# Patient Record
Sex: Male | Born: 1970 | Race: White | Hispanic: No | Marital: Married | Smoking: Current every day smoker
Health system: Southern US, Community
[De-identification: ages and names within clinical notes are randomized; demographics above are authoritative.]

---

## 2004-01-18 ENCOUNTER — Emergency Department: Payer: Self-pay | Admitting: Emergency Medicine

## 2004-12-16 ENCOUNTER — Emergency Department: Payer: Self-pay | Admitting: Internal Medicine

## 2005-01-20 ENCOUNTER — Other Ambulatory Visit: Payer: Self-pay

## 2005-01-20 ENCOUNTER — Emergency Department: Payer: Self-pay | Admitting: Emergency Medicine

## 2008-03-19 ENCOUNTER — Emergency Department: Payer: Self-pay | Admitting: Emergency Medicine

## 2010-04-11 ENCOUNTER — Emergency Department (HOSPITAL_COMMUNITY)
Admission: EM | Admit: 2010-04-11 | Discharge: 2010-04-11 | Disposition: A | Discharge status: Home / Self Care | Payer: Self-pay | Attending: Emergency Medicine | Admitting: Emergency Medicine

## 2010-04-11 ENCOUNTER — Emergency Department (HOSPITAL_COMMUNITY): Payer: Self-pay

## 2010-04-11 DIAGNOSIS — M795 Residual foreign body in soft tissue: Secondary | ICD-10-CM | POA: Insufficient documentation

## 2010-04-11 DIAGNOSIS — L02619 Cutaneous abscess of unspecified foot: Secondary | ICD-10-CM | POA: Insufficient documentation

## 2010-04-11 DIAGNOSIS — S8990XA Unspecified injury of unspecified lower leg, initial encounter: Secondary | ICD-10-CM | POA: Insufficient documentation

## 2010-04-11 DIAGNOSIS — Z1833 Retained wood fragments: Secondary | ICD-10-CM | POA: Insufficient documentation

## 2010-04-11 DIAGNOSIS — W268XXA Contact with other sharp object(s), not elsewhere classified, initial encounter: Secondary | ICD-10-CM | POA: Insufficient documentation

## 2010-04-11 DIAGNOSIS — Y92009 Unspecified place in unspecified non-institutional (private) residence as the place of occurrence of the external cause: Secondary | ICD-10-CM | POA: Insufficient documentation

## 2010-04-11 DIAGNOSIS — S99919A Unspecified injury of unspecified ankle, initial encounter: Secondary | ICD-10-CM | POA: Insufficient documentation

## 2010-04-11 DIAGNOSIS — M79609 Pain in unspecified limb: Secondary | ICD-10-CM | POA: Insufficient documentation

## 2010-04-11 DIAGNOSIS — L03119 Cellulitis of unspecified part of limb: Secondary | ICD-10-CM | POA: Insufficient documentation

## 2010-04-11 DIAGNOSIS — M7989 Other specified soft tissue disorders: Secondary | ICD-10-CM | POA: Insufficient documentation

## 2010-05-14 ENCOUNTER — Emergency Department (HOSPITAL_COMMUNITY)
Admission: EM | Admit: 2010-05-14 | Discharge: 2010-05-14 | Disposition: A | Discharge status: Home / Self Care | Payer: Self-pay | Attending: Emergency Medicine | Admitting: Emergency Medicine

## 2010-05-14 ENCOUNTER — Emergency Department (HOSPITAL_COMMUNITY): Payer: Self-pay

## 2010-05-14 DIAGNOSIS — M7989 Other specified soft tissue disorders: Secondary | ICD-10-CM | POA: Insufficient documentation

## 2010-05-14 DIAGNOSIS — M79609 Pain in unspecified limb: Secondary | ICD-10-CM | POA: Insufficient documentation

## 2010-05-14 DIAGNOSIS — L02619 Cutaneous abscess of unspecified foot: Secondary | ICD-10-CM | POA: Insufficient documentation

## 2010-05-14 LAB — GRAM STAIN

## 2010-05-16 LAB — WOUND CULTURE

## 2010-06-03 ENCOUNTER — Emergency Department: Payer: Self-pay | Admitting: Emergency Medicine

## 2010-09-07 ENCOUNTER — Emergency Department (HOSPITAL_COMMUNITY)
Admission: EM | Admit: 2010-09-07 | Discharge: 2010-09-07 | Disposition: A | Discharge status: Home / Self Care | Payer: Self-pay | Attending: Emergency Medicine | Admitting: Emergency Medicine

## 2010-09-07 DIAGNOSIS — M7989 Other specified soft tissue disorders: Secondary | ICD-10-CM | POA: Insufficient documentation

## 2010-09-07 DIAGNOSIS — L02619 Cutaneous abscess of unspecified foot: Secondary | ICD-10-CM | POA: Insufficient documentation

## 2010-09-07 DIAGNOSIS — M25473 Effusion, unspecified ankle: Secondary | ICD-10-CM | POA: Insufficient documentation

## 2010-09-07 DIAGNOSIS — M79609 Pain in unspecified limb: Secondary | ICD-10-CM | POA: Insufficient documentation

## 2010-09-07 DIAGNOSIS — M25579 Pain in unspecified ankle and joints of unspecified foot: Secondary | ICD-10-CM | POA: Insufficient documentation

## 2010-09-07 DIAGNOSIS — M25476 Effusion, unspecified foot: Secondary | ICD-10-CM | POA: Insufficient documentation

## 2010-09-07 DIAGNOSIS — R209 Unspecified disturbances of skin sensation: Secondary | ICD-10-CM | POA: Insufficient documentation

## 2010-09-11 ENCOUNTER — Emergency Department (HOSPITAL_COMMUNITY)
Admission: EM | Admit: 2010-09-11 | Discharge: 2010-09-11 | Disposition: A | Discharge status: Home / Self Care | Payer: Self-pay | Attending: Emergency Medicine | Admitting: Emergency Medicine

## 2010-09-11 DIAGNOSIS — Z1833 Retained wood fragments: Secondary | ICD-10-CM | POA: Insufficient documentation

## 2010-09-11 DIAGNOSIS — M795 Residual foreign body in soft tissue: Secondary | ICD-10-CM | POA: Insufficient documentation

## 2011-03-15 ENCOUNTER — Emergency Department: Payer: Self-pay | Admitting: *Deleted

## 2011-09-03 IMAGING — CR DG FOOT COMPLETE 3+V*L*
3 series · 3 of 3 positions shown · non-contrast
Comparison: Left foot radiographs 04/11/2010.

CLINICAL DATA: Pain and swelling.  Possible foreign body.

LEFT FOOT - COMPLETE 3+ VIEW

[t foot ap left]
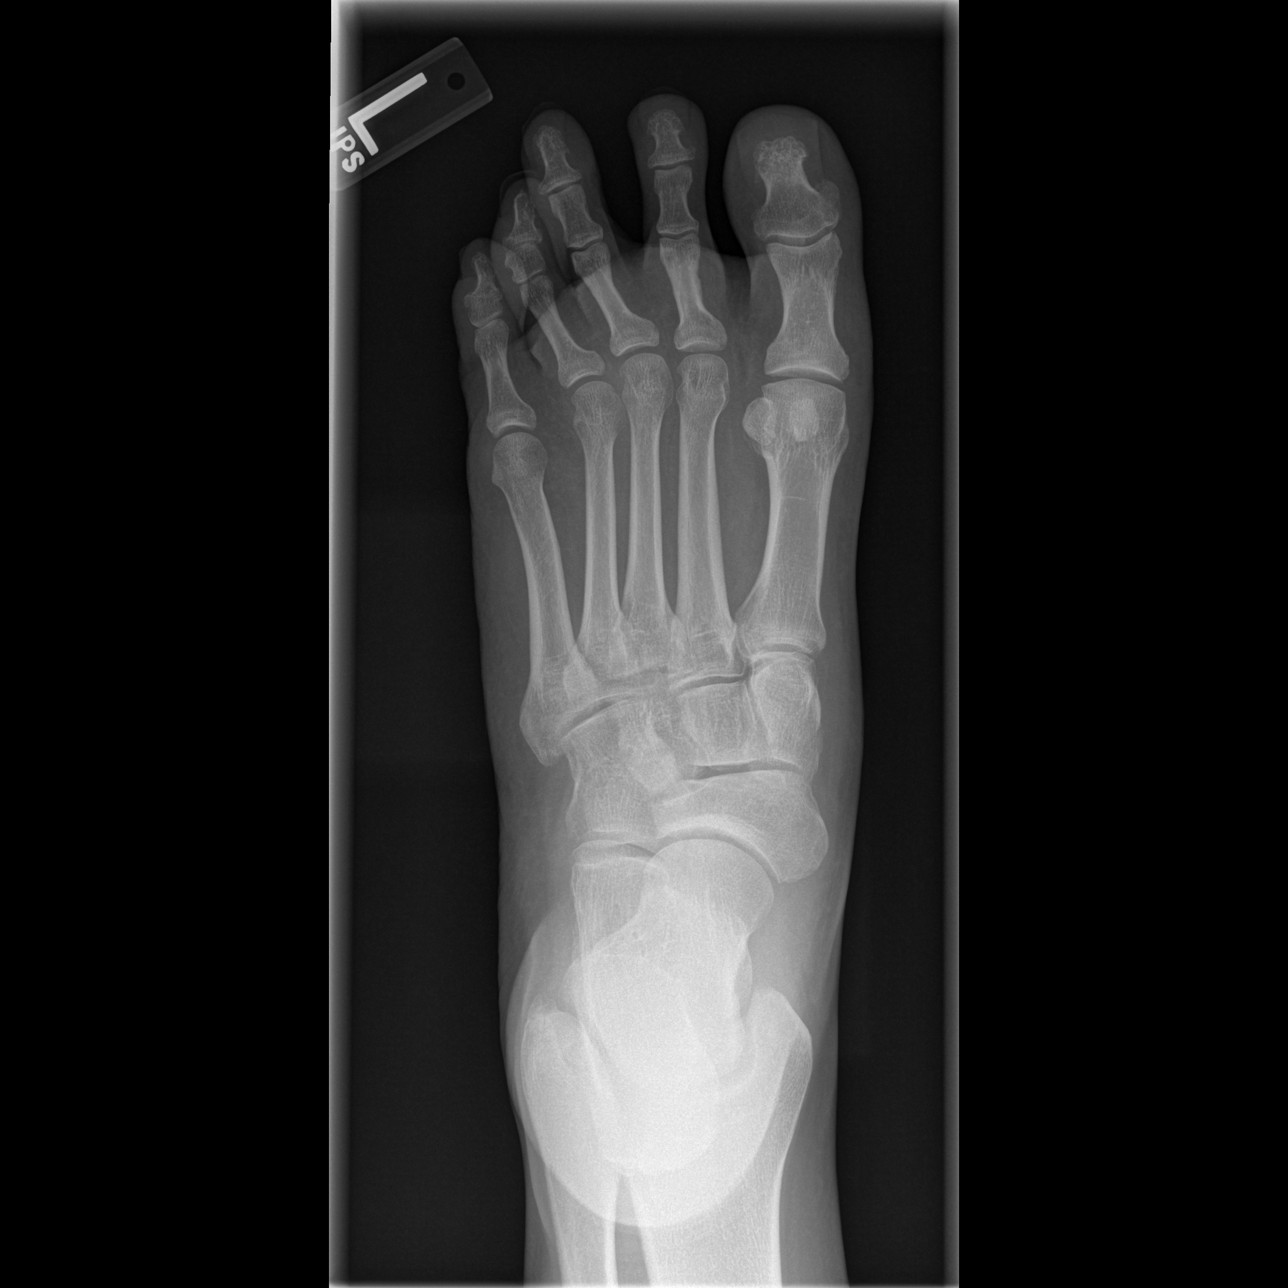

[t foot oblique left]
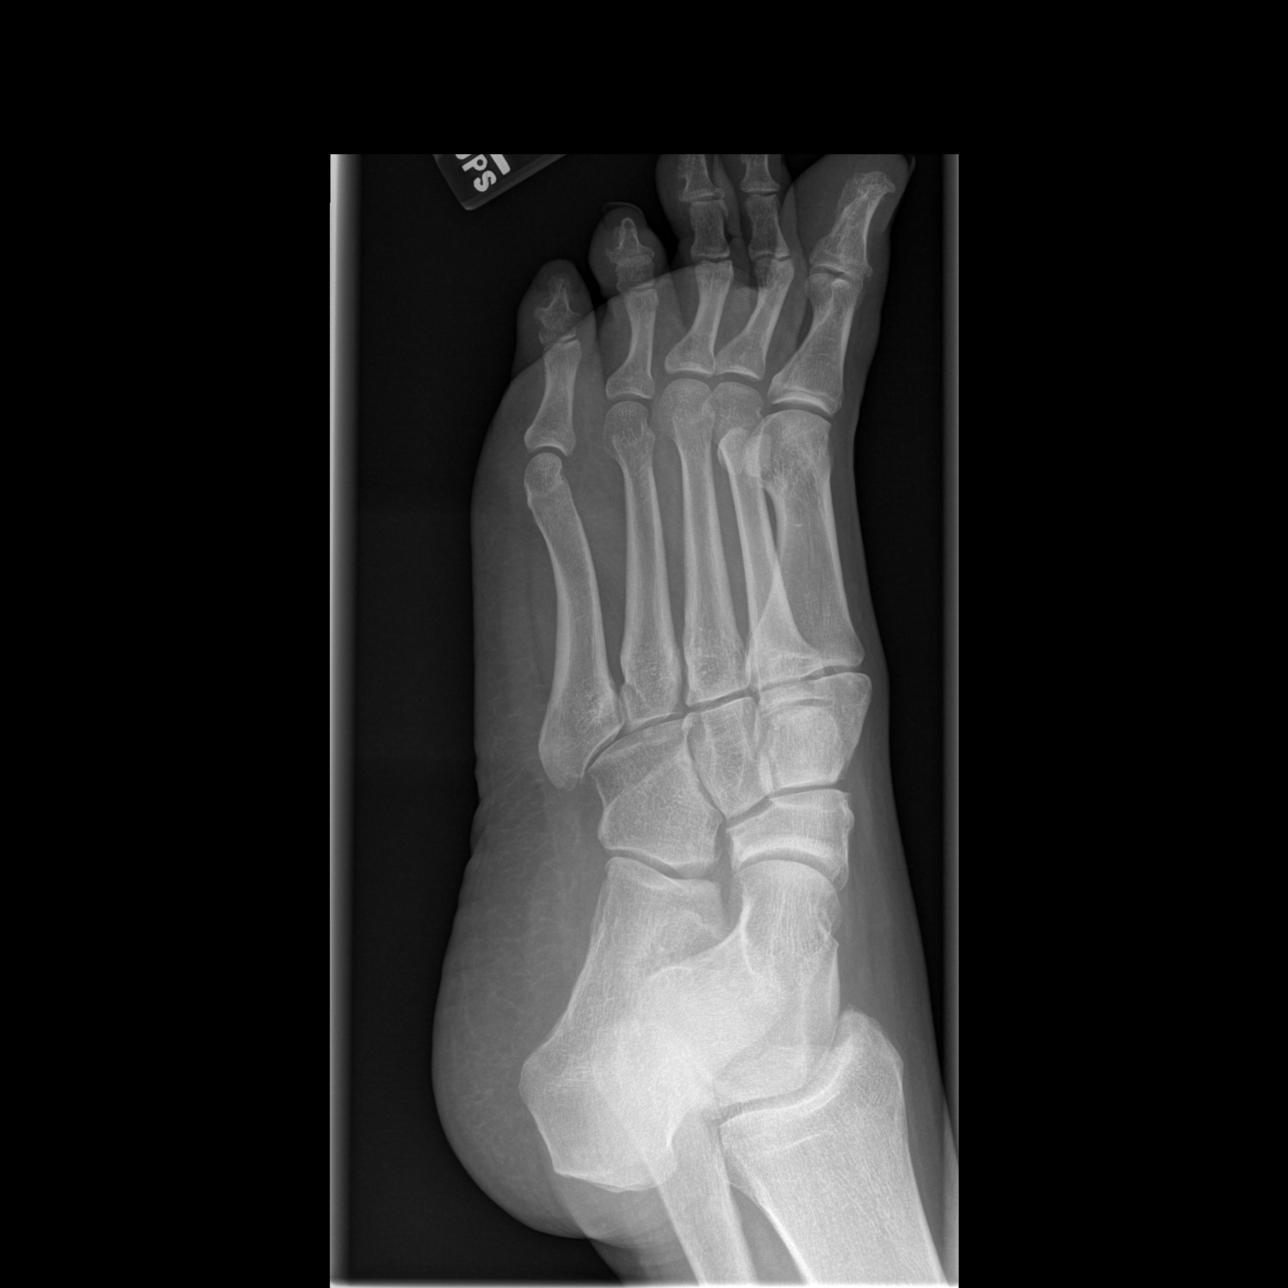

[t foot lat left]
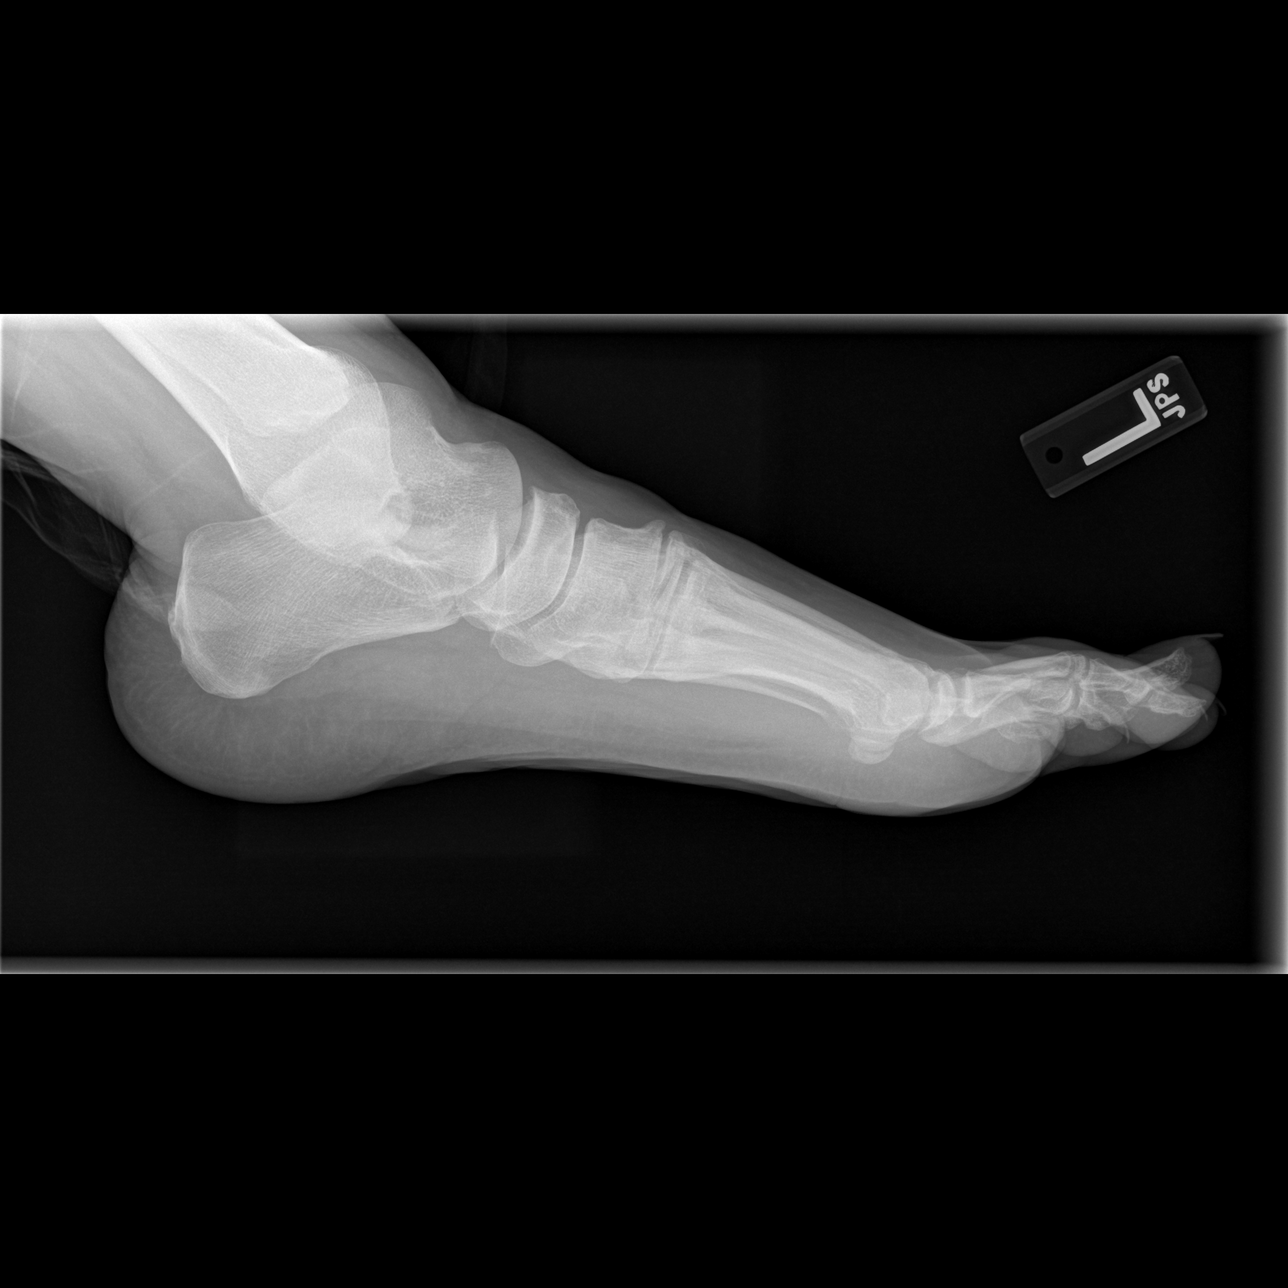

[3 of 3 positions shown; findings below may reference images not displayed]

FINDINGS: The joint spaces are maintained.  No definite plain film
findings for septic arthritis.  No destructive bony changes to
suggest osteomyelitis.  Soft tissue swelling between the second and
third digits is noted.
IMPRESSION: 1.  No acute bony findings or destructive bony changes.
2.  No radiopaque foreign body.

## 2011-11-09 ENCOUNTER — Emergency Department: Payer: Self-pay | Admitting: Emergency Medicine

## 2011-11-11 LAB — COMPREHENSIVE METABOLIC PANEL
Bilirubin,Total: 0.2 mg/dL (ref 0.2–1.0)
Calcium, Total: 8.8 mg/dL (ref 8.5–10.1)
Chloride: 112 mmol/L — ABNORMAL HIGH (ref 98–107)
Co2: 26 mmol/L (ref 21–32)
Creatinine: 1.04 mg/dL (ref 0.60–1.30)
EGFR (African American): >60
EGFR (Non-African Amer.): >60
Osmolality: 287 (ref 275–301)
Potassium: 4 mmol/L (ref 3.5–5.1)

## 2011-11-11 LAB — CBC
MCH: 29.8 pg (ref 26.0–34.0)
MCHC: 32.9 g/dL (ref 32.0–36.0)
Platelet: 299 10*3/uL (ref 150–440)

## 2011-11-11 LAB — DRUG SCREEN, URINE
Amphetamines, Ur Screen: NEGATIVE (ref ?–1000)
Benzodiazepine, Ur Scrn: NEGATIVE (ref ?–200)
Cannabinoid 50 Ng, Ur ~~LOC~~: POSITIVE (ref ?–50)
Cocaine Metabolite,Ur ~~LOC~~: NEGATIVE (ref ?–300)
MDMA (Ecstasy)Ur Screen: NEGATIVE (ref ?–500)
Opiate, Ur Screen: NEGATIVE (ref ?–300)
Phencyclidine (PCP) Ur S: NEGATIVE (ref ?–25)

## 2011-11-11 LAB — ETHANOL: Ethanol %: 0.215 % — ABNORMAL HIGH (ref 0.000–0.080)

## 2011-11-11 LAB — URINALYSIS, COMPLETE
Leukocyte Esterase: NEGATIVE
Nitrite: NEGATIVE
Ph: 7 (ref 4.5–8.0)
Protein: NEGATIVE
WBC UR: NONE SEEN /HPF (ref 0–5)

## 2011-11-11 LAB — TSH: Thyroid Stimulating Horm: 0.901 u[IU]/mL

## 2011-11-12 ENCOUNTER — Inpatient Hospital Stay: Payer: Self-pay | Admitting: Psychiatry

## 2012-02-02 ENCOUNTER — Emergency Department: Payer: Self-pay | Admitting: Emergency Medicine

## 2013-03-27 ENCOUNTER — Encounter (HOSPITAL_COMMUNITY): Payer: Self-pay | Admitting: Emergency Medicine

## 2013-03-27 ENCOUNTER — Emergency Department (HOSPITAL_COMMUNITY): Payer: Self-pay

## 2013-03-27 ENCOUNTER — Emergency Department (HOSPITAL_COMMUNITY)
Admission: EM | Admit: 2013-03-27 | Discharge: 2013-03-27 | Disposition: A | Discharge status: Home / Self Care | Payer: Self-pay | Attending: Emergency Medicine | Admitting: Emergency Medicine

## 2013-03-27 DIAGNOSIS — S39012A Strain of muscle, fascia and tendon of lower back, initial encounter: Secondary | ICD-10-CM

## 2013-03-27 DIAGNOSIS — F17200 Nicotine dependence, unspecified, uncomplicated: Secondary | ICD-10-CM | POA: Insufficient documentation

## 2013-03-27 DIAGNOSIS — R55 Syncope and collapse: Secondary | ICD-10-CM | POA: Insufficient documentation

## 2013-03-27 DIAGNOSIS — W1809XA Striking against other object with subsequent fall, initial encounter: Secondary | ICD-10-CM | POA: Insufficient documentation

## 2013-03-27 DIAGNOSIS — Y9389 Activity, other specified: Secondary | ICD-10-CM | POA: Insufficient documentation

## 2013-03-27 DIAGNOSIS — R112 Nausea with vomiting, unspecified: Secondary | ICD-10-CM | POA: Insufficient documentation

## 2013-03-27 DIAGNOSIS — S79929A Unspecified injury of unspecified thigh, initial encounter: Secondary | ICD-10-CM

## 2013-03-27 DIAGNOSIS — Y990 Civilian activity done for income or pay: Secondary | ICD-10-CM | POA: Insufficient documentation

## 2013-03-27 DIAGNOSIS — S79919A Unspecified injury of unspecified hip, initial encounter: Secondary | ICD-10-CM | POA: Insufficient documentation

## 2013-03-27 DIAGNOSIS — Y929 Unspecified place or not applicable: Secondary | ICD-10-CM | POA: Insufficient documentation

## 2013-03-27 DIAGNOSIS — E669 Obesity, unspecified: Secondary | ICD-10-CM | POA: Insufficient documentation

## 2013-03-27 DIAGNOSIS — R197 Diarrhea, unspecified: Secondary | ICD-10-CM | POA: Insufficient documentation

## 2013-03-27 DIAGNOSIS — S335XXA Sprain of ligaments of lumbar spine, initial encounter: Secondary | ICD-10-CM | POA: Insufficient documentation

## 2013-03-27 LAB — CBC
HEMATOCRIT: 41.6 % (ref 39.0–52.0)
HEMOGLOBIN: 14.3 g/dL (ref 13.0–17.0)
MCH: 30.7 pg (ref 26.0–34.0)
MCHC: 34.4 g/dL (ref 30.0–36.0)
MCV: 89.3 fL (ref 78.0–100.0)
Platelets: 213 10*3/uL (ref 150–400)
RBC: 4.66 MIL/uL (ref 4.22–5.81)
RDW: 13.6 % (ref 11.5–15.5)
WBC: 8.9 10*3/uL (ref 4.0–10.5)

## 2013-03-27 LAB — BASIC METABOLIC PANEL
BUN: 9 mg/dL (ref 6–23)
CALCIUM: 9.2 mg/dL (ref 8.4–10.5)
CO2: 24 mEq/L (ref 19–32)
Chloride: 104 mEq/L (ref 96–112)
Creatinine, Ser: 0.98 mg/dL (ref 0.50–1.35)
GFR calc Af Amer: >90 mL/min (ref >90)
GLUCOSE: 108 mg/dL — AB (ref 70–99)
Potassium: 3.7 mEq/L (ref 3.7–5.3)
Sodium: 140 mEq/L (ref 137–147)

## 2013-03-27 LAB — TROPONIN I: Troponin I: <0.3 ng/mL (ref <0.30)

## 2013-03-27 LAB — CBG MONITORING, ED: Glucose-Capillary: 104 mg/dL — ABNORMAL HIGH (ref 70–99)

## 2013-03-27 MED ORDER — ONDANSETRON HCL 4 MG/2ML IJ SOLN
4.0000 mg | Freq: Once | INTRAMUSCULAR | Status: AC
  Administered 2013-03-27: 4 mg via INTRAVENOUS
  Filled 2013-03-27: qty 2

## 2013-03-27 MED ORDER — DIAZEPAM 5 MG PO TABS: 5.0000 mg | ORAL_TABLET | Freq: Four times a day (QID) | ORAL | Status: DC | PRN

## 2013-03-27 MED ORDER — HYDROMORPHONE HCL PF 1 MG/ML IJ SOLN
1.0000 mg | Freq: Once | INTRAMUSCULAR | Status: AC
  Administered 2013-03-27: 1 mg via INTRAVENOUS
  Filled 2013-03-27: qty 1

## 2013-03-27 MED ORDER — SODIUM CHLORIDE 0.9 % IV BOLUS (SEPSIS)
1000.0000 mL | Freq: Once | INTRAVENOUS | Status: AC
  Administered 2013-03-27: 1000 mL via INTRAVENOUS

## 2013-03-27 MED ORDER — FENTANYL CITRATE 0.05 MG/ML IJ SOLN
50.0000 ug | Freq: Once | INTRAMUSCULAR | Status: AC
  Administered 2013-03-27: 50 ug via INTRAVENOUS
  Filled 2013-03-27: qty 2

## 2013-03-27 MED ORDER — OXYCODONE-ACETAMINOPHEN 5-325 MG PO TABS: 1.0000 | ORAL_TABLET | Freq: Four times a day (QID) | ORAL | Status: DC | PRN

## 2013-03-27 MED ORDER — KETOROLAC TROMETHAMINE 30 MG/ML IJ SOLN
30.0000 mg | Freq: Once | INTRAMUSCULAR | Status: AC
  Administered 2013-03-27: 30 mg via INTRAVENOUS
  Filled 2013-03-27: qty 1

## 2013-03-27 MED ORDER — PROMETHAZINE HCL 25 MG PO TABS: 25.0000 mg | ORAL_TABLET | Freq: Four times a day (QID) | ORAL | Status: DC | PRN

## 2013-03-27 NOTE — ED Notes (Signed)
Pt returned from xr

## 2013-03-27 NOTE — ED Notes (Signed)
Per ems-- pt had syncope episode at work. Fell and loc for a few seconds. Pt now c/o L sided back pain. Did not hit head. Pt pale diaphoretic upon ems arrival. nvd since yesterday as well. 12 lead unremarkable. Denies cp, sob. Has not seen pcp in 20 years and eats a cheeseburger everyday. Pt ambulatory on scene.

## 2013-03-27 NOTE — ED Notes (Signed)
Pt tolerated crackers and water.

## 2013-03-27 NOTE — Discharge Instructions (Signed)
Lumbosacral Strain Lumbosacral strain is a strain of any of the parts that make up your lumbosacral vertebrae. Your lumbosacral vertebrae are the bones that make up the lower third of your backbone. Your lumbosacral vertebrae are held together by muscles and tough, fibrous tissue (ligaments).  CAUSES  A sudden blow to your back can cause lumbosacral strain. Also, anything that causes an excessive stretch of the muscles in the low back can cause this strain. This is typically seen when people exert themselves strenuously, fall, lift heavy objects, bend, or crouch repeatedly. RISK FACTORS  Physically demanding work.  Participation in pushing or pulling sports or sports that require sudden twist of the back (tennis, golf, baseball).  Weight lifting.  Excessive lower back curvature.  Forward-tilted pelvis.  Weak back or abdominal muscles or both.  Tight hamstrings. SIGNS AND SYMPTOMS  Lumbosacral strain may cause pain in the area of your injury or pain that moves (radiates) down your leg.  DIAGNOSIS Your health care provider can often diagnose lumbosacral strain through a physical exam. In some cases, you may need tests such as X-ray exams.  TREATMENT  Treatment for your lower back injury depends on many factors that your clinician will have to evaluate. However, most treatment will include the use of anti-inflammatory medicines. HOME CARE INSTRUCTIONS   Avoid hard physical activities (tennis, racquetball, waterskiing) if you are not in proper physical condition for it. This may aggravate or create problems.  If you have a back problem, avoid sports requiring sudden body movements. Swimming and walking are generally safer activities.  Maintain good posture.  Maintain a healthy weight.  For acute conditions, you may put ice on the injured area.  Put ice in a plastic bag.  Place a towel between your skin and the bag.  Leave the ice on for 20 minutes, 2 3 times a day.  When the  low back starts healing, stretching and strengthening exercises may be recommended. SEEK MEDICAL CARE IF:  Your back pain is getting worse.  You experience severe back pain not relieved with medicines. SEEK IMMEDIATE MEDICAL CARE IF:   You have numbness, tingling, weakness, or problems with the use of your arms or legs.  There is a change in bowel or bladder control.  You have increasing pain in any area of the body, including your belly (abdomen).  You notice shortness of breath, dizziness, or feel faint.  You feel sick to your stomach (nauseous), are throwing up (vomiting), or become sweaty.  You notice discoloration of your toes or legs, or your feet get very cold. MAKE SURE YOU:   Understand these instructions.  Will watch your condition.  Will get help right away if you are not doing well or get worse. Document Released: 10/15/2004 Document Revised: 10/26/2012 Document Reviewed: 08/24/2012 [facility] Patient Information 2014 [place], [place].  Nausea and Vomiting Nausea is a sick feeling that often comes before throwing up (vomiting). Vomiting is a reflex where stomach contents come out of your mouth. Vomiting can cause severe loss of body fluids (dehydration). Children and elderly adults can become dehydrated quickly, especially if they also have diarrhea. Nausea and vomiting are symptoms of a condition or disease. It is important to find the cause of your symptoms. CAUSES   Direct irritation of the stomach lining. This irritation can result from increased acid production (gastroesophageal reflux disease), infection, food poisoning, taking certain medicines (such as nonsteroidal anti-inflammatory drugs), alcohol use, or tobacco use.  Signals from the brain.These signals could be  caused by a headache, heat exposure, an inner ear disturbance, increased pressure in the brain from injury, infection, a tumor, or a concussion, pain, emotional stimulus, or metabolic problems.  An  obstruction in the gastrointestinal tract (bowel obstruction).  Illnesses such as diabetes, hepatitis, gallbladder problems, appendicitis, kidney problems, cancer, sepsis, atypical symptoms of a heart attack, or eating disorders.  Medical treatments such as chemotherapy and radiation.  Receiving medicine that makes you sleep (general anesthetic) during surgery. DIAGNOSIS Your caregiver may ask for tests to be done if the problems do not improve after a few days. Tests may also be done if symptoms are severe or if the reason for the nausea and vomiting is not clear. Tests may include:  Urine tests.  Blood tests.  Stool tests.  Cultures (to look for evidence of infection).  X-rays or other imaging studies. Test results can help your caregiver make decisions about treatment or the need for additional tests. TREATMENT You need to stay well hydrated. Drink frequently but in small amounts.You may wish to drink water, sports drinks, clear broth, or eat frozen ice pops or gelatin dessert to help stay hydrated.When you eat, eating slowly may help prevent nausea.There are also some antinausea medicines that may help prevent nausea. HOME CARE INSTRUCTIONS   Take all medicine as directed by your caregiver.  If you do not have an appetite, do not force yourself to eat. However, you must continue to drink fluids.  If you have an appetite, eat a normal diet unless your caregiver tells you differently.  Eat a variety of complex carbohydrates (rice, wheat, potatoes, bread), lean meats, yogurt, fruits, and vegetables.  Avoid high-fat foods because they are more difficult to digest.  Drink enough water and fluids to keep your urine clear or pale yellow.  If you are dehydrated, ask your caregiver for specific rehydration instructions. Signs of dehydration may include:  Severe thirst.  Dry lips and mouth.  Dizziness.  Dark urine.  Decreasing urine frequency and  amount.  Confusion.  Rapid breathing or pulse. SEEK IMMEDIATE MEDICAL CARE IF:   You have blood or brown flecks (like coffee grounds) in your vomit.  You have black or bloody stools.  You have a severe headache or stiff neck.  You are confused.  You have severe abdominal pain.  You have chest pain or trouble breathing.  You do not urinate at least once every 8 hours.  You develop cold or clammy skin.  You continue to vomit for longer than 24 to 48 hours.  You have a fever. MAKE SURE YOU:   Understand these instructions.  Will watch your condition.  Will get help right away if you are not doing well or get worse. Document Released: 01/05/2005 Document Revised: 03/30/2011 Document Reviewed: 06/04/2010 [facility] Patient Information 2014 [place], [place].  Syncope Syncope is a fainting spell. This means the person loses consciousness and drops to the ground. The person is generally unconscious for less than 5 minutes. The person may have some muscle twitches for up to 15 seconds before waking up and returning to normal. Syncope occurs more often in elderly people, but it can happen to anyone. While most causes of syncope are not dangerous, syncope can be a sign of a serious medical problem. It is important to seek medical care.  CAUSES  Syncope is caused by a sudden decrease in blood flow to the brain. The specific cause is often not determined. Factors that can trigger syncope include:  Taking medicines that  lower blood pressure.  Sudden changes in posture, such as standing up suddenly.  Taking more medicine than prescribed.  Standing in one place for too long.  Seizure disorders.  Dehydration and excessive exposure to heat.  Low blood sugar (hypoglycemia).  Straining to have a bowel movement.  Heart disease, irregular heartbeat, or other circulatory problems.  Fear, emotional distress, seeing blood, or severe pain. SYMPTOMS  Right before fainting, you  may:  Feel dizzy or lightheaded.  Feel nauseous.  See all white or all black in your field of vision.  Have cold, clammy skin. DIAGNOSIS  Your caregiver will ask about your symptoms, perform a physical exam, and perform electrocardiography (ECG) to record the electrical activity of your heart. Your caregiver may also perform other heart or blood tests to determine the cause of your syncope. TREATMENT  In most cases, no treatment is needed. Depending on the cause of your syncope, your caregiver may recommend changing or stopping some of your medicines. HOME CARE INSTRUCTIONS  Have someone stay with you until you feel stable.  Do not drive, operate machinery, or play sports until your caregiver says it is okay.  Keep all follow-up appointments as directed by your caregiver.  Lie down right away if you start feeling like you might faint. Breathe deeply and steadily. Wait until all the symptoms have passed.  Drink enough fluids to keep your urine clear or pale yellow.  If you are taking blood pressure or heart medicine, get up slowly, taking several minutes to sit and then stand. This can reduce dizziness. SEEK IMMEDIATE MEDICAL CARE IF:   You have a severe headache.  You have unusual pain in the chest, abdomen, or back.  You are bleeding from the mouth or rectum, or you have black or tarry stool.  You have an irregular or very fast heartbeat.  You have pain with breathing.  You have repeated fainting or seizure-like jerking during an episode.  You faint when sitting or lying down.  You have confusion.  You have difficulty walking.  You have severe weakness.  You have vision problems. If you fainted, call your local emergency services (911 in [place] Do not drive yourself to the hospital.  MAKE SURE YOU:  Understand these instructions.  Will watch your condition.  Will get help right away if you are not doing well or get worse. Document Released: 01/05/2005  Document Revised: 07/07/2011 Document Reviewed: 03/06/2011 [facility] Patient Information 2014 [place], [place].

## 2013-03-27 NOTE — ED Provider Notes (Signed)
CSN: [id number]     Arrival date & time 03/27/13  1555 History   First MD Initiated Contact with Patient 03/27/13 1632     Chief Complaint  Patient presents with  . Loss of Consciousness     (Consider location/radiation/quality/duration/timing/severity/associated sxs/prior Treatment) Patient is a 43 y.o. male presenting with syncope. The history is provided by the patient.  Loss of Consciousness Associated symptoms: nausea and vomiting   Associated symptoms: no chest pain, no headaches, no shortness of breath and no weakness    patient is complaining of syncope. He states he was working today and began to feel lightheaded and tingly. He states he then passed out. He denies chest pain. He denies feeling his heart racing to slow. He states he woke up not exactly known what happened. He has had some nausea vomiting diarrhea for the last day or 2. No fevers. He felt somewhat fatigued earlier today, but states do not feel anything was bad enough to make him pass out. He's had one episode like this in the past. No swelling in his legs. He states it feels better now. He states he was helped to the ground, but hit his left back on the way down. He states he has severe pain in his left hip and back has been unrelieved pain medicine given by EMS. No numbness or weakness. He does not have previous back problems.  History reviewed. No pertinent past medical history. History reviewed. No pertinent past surgical history. No family history on file. History  Substance Use Topics  . Smoking status: Current Every Day Smoker  . Smokeless tobacco: Not on file  . Alcohol Use: No    Review of Systems  Constitutional: Negative for activity change and appetite change.  Eyes: Negative for pain.  Respiratory: Negative for chest tightness and shortness of breath.   Cardiovascular: Positive for syncope. Negative for chest pain and leg swelling.  Gastrointestinal: Positive for nausea, vomiting and diarrhea.  Negative for abdominal pain.  Genitourinary: Negative for flank pain.  Musculoskeletal: Positive for back pain. Negative for neck pain and neck stiffness.  Skin: Negative for rash.  Neurological: Positive for syncope. Negative for weakness, numbness and headaches.  Psychiatric/Behavioral: Negative for behavioral problems.      Allergies  Review of patient's allergies indicates no known allergies.  Home Medications   Current Outpatient Rx  Name  Route  Sig  Dispense  Refill  . ALPRAZolam (XANAX) 0.5 MG tablet   Oral   Take 0.5 mg by mouth daily as needed for anxiety.         Marland Kitchen guaiFENesin (MUCINEX) 600 MG 12 hr tablet   Oral   Take 600 mg by mouth daily as needed. For cold         . guaiFENesin (ROBITUSSIN) 100 MG/5ML liquid   Oral   Take 200 mg by mouth daily as needed for cough or congestion.         Marland Kitchen ibuprofen (ADVIL,MOTRIN) 200 MG tablet   Oral   Take 200 mg by mouth every 6 (six) hours as needed. For teeth pain         . Nutritional Supplements (COLD AND FLU) THERAPY PACK MISC   Oral   Take 1 each by mouth daily as needed. Patient states he takes 1 packet as needed for cold.         . diazepam (VALIUM) 5 MG tablet   Oral   Take 1 tablet (5 mg total) by mouth every  6 (six) hours as needed for muscle spasms.   10 tablet   0   . oxyCODONE-acetaminophen (PERCOCET/ROXICET) 5-325 MG per tablet   Oral   Take 1-2 tablets by mouth every 6 (six) hours as needed for severe pain.   115 tablet   0   . promethazine (PHENERGAN) 25 MG tablet   Oral   Take 1 tablet (25 mg total) by mouth every 6 (six) hours as needed for nausea or vomiting.   10 tablet   0    BP 109/65  Pulse 63  Temp(Src) 98.7 F (37.1 C) (Oral)  Resp 15  Ht 5\' 11"  (1.803 m)  Wt 240 lb (108.863 kg)  BMI 33.49 kg/m2  SpO2 91% Physical Exam  Nursing note and vitals reviewed. Constitutional: He is oriented to person, place, and time. He appears well-developed and well-nourished.   Patient is obese  HENT:  Head: Normocephalic and atraumatic.  Eyes: EOM are normal. Pupils are equal, round, and reactive to light.  Neck: Normal range of motion. Neck supple.  Cardiovascular: Normal rate, regular rhythm and normal heart sounds.   No murmur heard. Pulmonary/Chest: Effort normal and breath sounds normal.  Abdominal: Soft. Bowel sounds are normal. He exhibits no distension and no mass. There is no tenderness. There is no rebound and no guarding.  Musculoskeletal: Normal range of motion. He exhibits tenderness. He exhibits no edema.  Tenderness to lower lumbar spine and left SI area. No step-off or deformity. Some mild pain with straight leg raise on left. Neurovascularly intact over bilateral lower extremities  Neurological: He is alert and oriented to person, place, and time. No cranial nerve deficit.  Skin: Skin is warm and dry.  Psychiatric: He has a normal mood and affect.    ED Course  Procedures (including critical care time) Labs Review Labs Reviewed  BASIC METABOLIC PANEL - Abnormal; Notable for the following:    Glucose, Bld 108 (*)    All other components within normal limits  CBG MONITORING, ED - Abnormal; Notable for the following:    Glucose-Capillary 104 (*)    All other components within normal limits  CBC  TROPONIN I   Imaging Review Dg Chest 2 View  03/27/2013   CLINICAL DATA:  Syncope today.  No chest complaints.  EXAM: CHEST  2 VIEW  COMPARISON:  None.  FINDINGS: The heart size and mediastinal contours are within normal limits. Both lungs are clear. The visualized skeletal structures are unremarkable.  IMPRESSION: No active cardiopulmonary disease.   Electronically Signed   By: Amie Portland M.D.   On: 03/27/2013 17:28   Dg Lumbar Spine Complete  03/27/2013   CLINICAL DATA:  Syncope today.  Low back pain.  EXAM: LUMBAR SPINE - COMPLETE 4+ VIEW  COMPARISON:  None.  FINDINGS: There is no evidence of lumbar spine fracture. Alignment is normal.  Intervertebral disc spaces are maintained.  IMPRESSION: Negative.   Electronically Signed   By: Amie Portland M.D.   On: 03/27/2013 17:28   Dg Pelvis 1-2 Views  03/27/2013   CLINICAL DATA:  Syncope today.  Posterior left pelvic pain.  EXAM: PELVIS - 1-2 VIEW  COMPARISON:  None.  FINDINGS: There is no evidence of pelvic fracture or diastasis. No other pelvic bone lesions are seen.  IMPRESSION: Negative.   Electronically Signed   By: Amie Portland M.D.   On: 03/27/2013 17:29     EKG Interpretation None      Date: 03/27/2013  Rate: 77  Rhythm: normal sinus rhythm  QRS Axis: normal  Intervals: normal  ST/T Wave abnormalities: normal  Conduction Disutrbances:none  Narrative Interpretation: possible left atrial enlargement  Old EKG Reviewed: none available   MDM   Final diagnoses:  Syncope  Nausea vomiting and diarrhea  Lumbar strain    Patient with syncope. Likely related to dehydration and possibly vagal event with his nausea vomiting diarrhea. He does have some lumbar back pain. Negative x-rays. He feels much better after treatment and will be discharged home.    Juliet RudeNathan R. Rubin PayorPickering, MD 03/27/13 [phone]

## 2013-03-27 NOTE — ED Notes (Signed)
Pt transported to xray 

## 2013-03-27 NOTE — ED Notes (Signed)
Pt in nad. 
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
## 2016-11-20 ENCOUNTER — Encounter (HOSPITAL_COMMUNITY): Payer: Self-pay | Admitting: Emergency Medicine

## 2016-11-20 ENCOUNTER — Emergency Department (HOSPITAL_COMMUNITY)
Admission: EM | Admit: 2016-11-20 | Discharge: 2016-11-20 | Disposition: A | Discharge status: Home / Self Care | Payer: Self-pay | Attending: Emergency Medicine | Admitting: Emergency Medicine

## 2016-11-20 DIAGNOSIS — F17210 Nicotine dependence, cigarettes, uncomplicated: Secondary | ICD-10-CM | POA: Insufficient documentation

## 2016-11-20 DIAGNOSIS — Y990 Civilian activity done for income or pay: Secondary | ICD-10-CM | POA: Insufficient documentation

## 2016-11-20 DIAGNOSIS — T148XXA Other injury of unspecified body region, initial encounter: Secondary | ICD-10-CM

## 2016-11-20 DIAGNOSIS — Y929 Unspecified place or not applicable: Secondary | ICD-10-CM | POA: Insufficient documentation

## 2016-11-20 DIAGNOSIS — X500XXA Overexertion from strenuous movement or load, initial encounter: Secondary | ICD-10-CM | POA: Insufficient documentation

## 2016-11-20 DIAGNOSIS — S39012A Strain of muscle, fascia and tendon of lower back, initial encounter: Secondary | ICD-10-CM | POA: Insufficient documentation

## 2016-11-20 DIAGNOSIS — Z79899 Other long term (current) drug therapy: Secondary | ICD-10-CM | POA: Insufficient documentation

## 2016-11-20 DIAGNOSIS — Y9389 Activity, other specified: Secondary | ICD-10-CM | POA: Insufficient documentation

## 2016-11-20 DIAGNOSIS — M79606 Pain in leg, unspecified: Secondary | ICD-10-CM | POA: Insufficient documentation

## 2016-11-20 DIAGNOSIS — R202 Paresthesia of skin: Secondary | ICD-10-CM | POA: Insufficient documentation

## 2016-11-20 MED ORDER — DIAZEPAM 5 MG PO TABS
10.0000 mg | ORAL_TABLET | Freq: Once | ORAL | Status: AC
  Administered 2016-11-20: 10 mg via ORAL
  Filled 2016-11-20: qty 2

## 2016-11-20 MED ORDER — GABAPENTIN 300 MG PO CAPS: 300.0000 mg | ORAL_CAPSULE | Freq: Three times a day (TID) | ORAL | 0 refills | Status: DC

## 2016-11-20 MED ORDER — CYCLOBENZAPRINE HCL 10 MG PO TABS: 10.0000 mg | ORAL_TABLET | Freq: Two times a day (BID) | ORAL | 0 refills | Status: DC | PRN

## 2016-11-20 MED ORDER — KETOROLAC TROMETHAMINE 60 MG/2ML IM SOLN
60.0000 mg | Freq: Once | INTRAMUSCULAR | Status: AC
  Administered 2016-11-20: 60 mg via INTRAMUSCULAR
  Filled 2016-11-20: qty 2

## 2016-11-20 NOTE — Discharge Instructions (Signed)
1 tablet of Flexeril, which is a muscle relaxer, may be taken up to 2 times per day as needed for muscle pain or spasms.  Do not drive or work while taking this medication because it can make you too sleepy to drive or work.  I have refilled your gabapentin at the dose that you were previously taking.  Take 1 tablet of 300 mg once every 8 hours for the next 5 days.  If this medication continues to help you, please call the number on your discharge paperwork to get established with a primary care provider.  You may also call [facility] to tell them you are a follow-up visit from the emergency department.  600 mg of ibuprofen may be taken once every 6-8 hours with food to help with pain and inflammation.  Apply ice for 15-20 minutes up to 3-4 times a day to help with your symptoms as well.  As your pain allows, please begin to stretch your muscles so that they do not get tight.  If you develop worsening symptoms including losing control of your bowel or bladder, weakness and your legs, a new injury, a rash, or any other new concerning symptoms, please return to the emergency department for reevaluation.

## 2016-11-20 NOTE — ED Notes (Signed)
Patient verbalized understanding of discharge instructions and denies any further needs or questions at this time. VS stable. Patient ambulatory with steady gait. Escorted to ED entrance in wheelchair.   

## 2016-11-20 NOTE — ED Triage Notes (Signed)
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 Described as sharp, shooting. States has had this happen before. Able to ambulate slowly.

## 2016-11-20 NOTE — ED Provider Notes (Signed)
[facility] EMERGENCY DEPARTMENT Provider Note   CSN: [id number] Arrival date & time: 11/20/16  1808     History   Chief Complaint Chief Complaint  Patient presents with  . Back Pain    HPI Michael House is a 46 y.o. male.  The history is provided by the patient. No language interpreter was used.  Back Pain   This is a new problem. The current episode started 6 to 12 hours ago. The problem occurs constantly. The problem has been gradually worsening. 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 The pain is present in the lumbar spine. The quality of the pain is described as shooting. The pain radiates to the left knee. The pain is at a severity of 10/10. The pain is severe. The symptoms are aggravated by bending and twisting (walking). Associated symptoms include leg pain and paresthesias. Pertinent negatives include no chest pain, no numbness, no abdominal pain, no bowel incontinence, no perianal numbness, no pelvic pain, no paresis, no tingling and no weakness. He has tried nothing for the symptoms. Risk factors include obesity.  He also denies dysuria, fever, and chills. No h/o of nephrolithiasis.   History reviewed. No pertinent past medical history.  There are no active problems to display for this patient.   History reviewed. No pertinent surgical history.     Home Medications    Prior to Admission medications   Medication Sig Start Date End Date Taking? Authorizing Provider  ALPRAZolam Prudy Feeler) 0.5 MG tablet Take 0.5 mg by mouth daily as needed for anxiety.    [provider]  cyclobenzaprine (FLEXERIL) 10 MG tablet Take 1 tablet (10 mg total) by mouth 2 (two) times daily as needed for muscle spasms. 11/20/16   Nina Mondor A, PA-C  diazepam (VALIUM) 5 MG tablet Take 1 tablet (5 mg total) by mouth every 6 (six) hours as needed for muscle spasms. 03/27/13   Benjiman Core, MD  gabapentin (NEURONTIN) 300 MG capsule Take 1 capsule  (300 mg total) by mouth 3 (three) times daily. 11/20/16 11/25/16  Avangeline Stockburger A, PA-C  guaiFENesin (MUCINEX) 600 MG 12 hr tablet Take 600 mg by mouth daily as needed. For cold    [provider]  guaiFENesin (ROBITUSSIN) 100 MG/5ML liquid Take 200 mg by mouth daily as needed for cough or congestion.    [provider]  ibuprofen (ADVIL,MOTRIN) 200 MG tablet Take 200 mg by mouth every 6 (six) hours as needed. For teeth pain    [provider]  Nutritional Supplements (COLD AND FLU) THERAPY PACK MISC Take 1 each by mouth daily as needed. Patient states he takes 1 packet as needed for cold.    [provider]  oxyCODONE-acetaminophen (PERCOCET/ROXICET) 5-325 MG per tablet Take 1-2 tablets by mouth every 6 (six) hours as needed for severe pain. 03/27/13   Benjiman Core, MD  promethazine (PHENERGAN) 25 MG tablet Take 1 tablet (25 mg total) by mouth every 6 (six) hours as needed for nausea or vomiting. 03/27/13   Benjiman Core, MD    Family History History reviewed. No pertinent family history.  Social History Social History  Substance Use Topics  . Smoking status: Current Every Day Smoker  . Smokeless tobacco: Never Used  . Alcohol use No     Allergies   Patient has no known allergies.   Review of Systems Review of Systems  Constitutional: Negative for activity change.  Respiratory: Negative for shortness of breath.   Cardiovascular:  Negative for chest pain.  Gastrointestinal: Negative for abdominal pain and bowel incontinence.  Genitourinary: Negative for pelvic pain.  Musculoskeletal: Positive for back pain.  Skin: Negative for rash.  Neurological: Positive for paresthesias. Negative for tingling, weakness and numbness.     Physical Exam Updated Vital Signs BP (!) 157/94 (BP Location: Left Arm)   Pulse 92   Temp 98.5 F (36.9 C) (Oral)   Resp 18   SpO2 98%   Physical Exam  Constitutional: He is oriented to person, place, and time.  He appears well-developed.  HENT:  Head: Normocephalic.  Eyes: Conjunctivae are normal.  Neck: Neck supple.  Cardiovascular: Normal rate and regular rhythm.   No murmur heard. Pulmonary/Chest: Effort normal.  Abdominal: Soft. He exhibits no distension.  Musculoskeletal: Normal range of motion. He exhibits tenderness. He exhibits no edema or deformity.  TTP over the left paraspinal muscles of the lumbar spine. No tenderness to palpation of the spinous processes of the cervical, thoracic, or lumbar spine.  Able to bear weight on the bilateral lower extremities.  Independently moves all digits on the bilateral feet. Antalgic gait. FROM of the bilateral hips, knees, and ankles. 2+ DP and PT pulses. 5/5 strength of the bilateral lower extremities. No sensory deficits.  No overlying ecchymosis, erythema, edema, warmth, or rash of the back or bilateral lower extremities.   Neurological: He is alert and oriented to person, place, and time.  Skin: Skin is warm and dry. No rash noted.  Psychiatric: His behavior is normal.  Nursing note and vitals reviewed.    ED Treatments / Results  Labs (all labs ordered are listed, but only abnormal results are displayed) Labs Reviewed - No data to display  EKG  EKG Interpretation None       Radiology No results found.  Procedures Procedures (including critical care time)  Medications Ordered in ED Medications  diazepam (VALIUM) tablet 10 mg (10 mg Oral Given 11/20/16 2057)  ketorolac (TORADOL) injection 60 mg (60 mg Intramuscular Given 11/20/16 2057)     Initial Impression / Assessment and Plan / ED Course  I have reviewed the triage vital signs and the nursing notes.  Pertinent labs & imaging results that were available during my care of the patient were reviewed by me and considered in my medical decision making (see chart for details).     Patient with back pain, mechanical in nature.  No neurological deficits and normal neuro exam.   Patient can walk but states it is painful.  No urinary or bowel incontinence.  No concern for cauda equina, infection, AAA, infection, or fracture.  No constitutional symptoms including fever, chills, or weight loss,  No h/o cancer, IVDU.  Will discharge to home with RICE protocol and anti-inflammatory medicine.  The patient was previously taking gabapentin, but has run out of the medication.  Will re-prescribe his same home dose for a 5-day course and encourage the patient to follow-up with primary care if this improves his symptoms. discussed ED return precaution and indications for PCP follow up. The patient acknowledges the plan and is agreeable at this time.   Final Clinical Impressions(s) / ED Diagnoses   Final diagnoses:  Strain of lumbar region, initial encounter  Muscle strain    New Prescriptions New Prescriptions   CYCLOBENZAPRINE (FLEXERIL) 10 MG TABLET    Take 1 tablet (10 mg total) by mouth 2 (two) times daily as needed for muscle spasms.   GABAPENTIN (NEURONTIN) 300 MG CAPSULE  Take 1 capsule (300 mg total) by mouth 3 (three) times daily.     Barkley BoardsMcDonald, Lorin Hauck A, PA-C 11/20/16 2110    Jacalyn LefevreHaviland, Julie, MD 11/20/16 2120

## 2016-12-07 ENCOUNTER — Other Ambulatory Visit: Payer: Self-pay

## 2016-12-07 ENCOUNTER — Encounter (HOSPITAL_COMMUNITY): Payer: Self-pay | Admitting: *Deleted

## 2016-12-07 ENCOUNTER — Emergency Department (HOSPITAL_COMMUNITY)
Admission: EM | Admit: 2016-12-07 | Discharge: 2016-12-07 | Disposition: A | Discharge status: Home / Self Care | Payer: Self-pay | Attending: Emergency Medicine | Admitting: Emergency Medicine

## 2016-12-07 DIAGNOSIS — Z79899 Other long term (current) drug therapy: Secondary | ICD-10-CM | POA: Insufficient documentation

## 2016-12-07 DIAGNOSIS — F17210 Nicotine dependence, cigarettes, uncomplicated: Secondary | ICD-10-CM | POA: Insufficient documentation

## 2016-12-07 DIAGNOSIS — L0501 Pilonidal cyst with abscess: Secondary | ICD-10-CM | POA: Insufficient documentation

## 2016-12-07 MED ORDER — LIDOCAINE-EPINEPHRINE (PF) 2 %-1:200000 IJ SOLN
10.0000 mL | Freq: Once | INTRAMUSCULAR | Status: AC
  Administered 2016-12-07: 10 mL
  Filled 2016-12-07: qty 20

## 2016-12-07 MED ORDER — OXYCODONE-ACETAMINOPHEN 5-325 MG PO TABS
1.0000 | ORAL_TABLET | Freq: Once | ORAL | Status: AC
  Administered 2016-12-07: 1 via ORAL
  Filled 2016-12-07: qty 1

## 2016-12-07 MED ORDER — AMOXICILLIN-POT CLAVULANATE 875-125 MG PO TABS: 1.0000 | ORAL_TABLET | Freq: Two times a day (BID) | ORAL | 0 refills | Status: DC

## 2016-12-07 MED ORDER — IBUPROFEN 800 MG PO TABS: 800.0000 mg | ORAL_TABLET | Freq: Three times a day (TID) | ORAL | 0 refills | Status: DC | PRN

## 2016-12-07 MED ORDER — OXYCODONE-ACETAMINOPHEN 5-325 MG PO TABS: 2.0000 | ORAL_TABLET | Freq: Four times a day (QID) | ORAL | 0 refills | Status: DC | PRN
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
## 2016-12-07 NOTE — ED Provider Notes (Addendum)
[facility] Provider Note   CSN: [id number] Arrival date & time: 12/07/16  1029     History   Chief Complaint Chief Complaint  Patient presents with  . Abscess    HPI Michael House is a 46 y.o. male.  The history is provided by the patient. No language interpreter was used.  Abscess    Michael House is a 46 y.o. male who presents to the Emergency Department complaining of abscess.  He reports pain and swelling and describes his symptoms as an abscess in the gluteal cleft.  Symptoms started 2-3 days ago.  He denies any fevers, nausea, vomiting, abdominal pain.  No prior similar symptoms.  He states that while waiting in the emergency department that it is popped on its own and is leaking fluid.  He has no medical problems and takes no medications.  History reviewed. No pertinent past medical history.  There are no active problems to display for this patient.   History reviewed. No pertinent surgical history.     Home Medications    Prior to Admission medications   Medication Sig Start Date End Date Taking? Authorizing Provider  ALPRAZolam Prudy Feeler(XANAX) 0.5 MG tablet Take 0.5 mg by mouth daily as needed for anxiety.    [provider]  amoxicillin-clavulanate (AUGMENTIN) 875-125 MG tablet Take 1 tablet every 12 (twelve) hours by mouth. 12/07/16   Tilden Fossaees, Rebel Willcutt, MD  cyclobenzaprine (FLEXERIL) 10 MG tablet Take 1 tablet (10 mg total) by mouth 2 (two) times daily as needed for muscle spasms. 11/20/16   McDonald, Mia A, PA-C  diazepam (VALIUM) 5 MG tablet Take 1 tablet (5 mg total) by mouth every 6 (six) hours as needed for muscle spasms. 03/27/13   Benjiman CorePickering, Nathan, MD  gabapentin (NEURONTIN) 300 MG capsule Take 1 capsule (300 mg total) by mouth 3 (three) times daily. 11/20/16 11/25/16  McDonald, Mia A, PA-C  guaiFENesin (MUCINEX) 600 MG 12 hr tablet Take 600 mg by mouth daily as needed. For cold    [provider]    guaiFENesin (ROBITUSSIN) 100 MG/5ML liquid Take 200 mg by mouth daily as needed for cough or congestion.    [provider]  ibuprofen (ADVIL,MOTRIN) 800 MG tablet Take 1 tablet (800 mg total) every 8 (eight) hours as needed by mouth for moderate pain. 12/07/16   Tilden Fossaees, Reola Buckles, MD  Nutritional Supplements (COLD AND FLU) THERAPY PACK MISC Take 1 each by mouth daily as needed. Patient states he takes 1 packet as needed for cold.    [provider]  oxyCODONE-acetaminophen (PERCOCET/ROXICET) 5-325 MG tablet Take 2 tablets every 6 (six) hours as needed by mouth for severe pain. 12/07/16   Tilden Fossaees, Cassie Henkels, MD  promethazine (PHENERGAN) 25 MG tablet Take 1 tablet (25 mg total) by mouth every 6 (six) hours as needed for nausea or vomiting. 03/27/13   Benjiman CorePickering, Nathan, MD    Family History No family history on file.  Social History Social History   Tobacco Use  . Smoking status: Current Every Day Smoker  . Smokeless tobacco: Never Used  Substance Use Topics  . Alcohol use: No  . Drug use: No     Allergies   Patient has no known allergies.   Review of Systems Review of Systems  All other systems reviewed and are negative.    Physical Exam Updated Vital Signs BP 118/81 (BP Location: Right Arm)   Pulse 94   Temp 98.6 F (37 C) (Oral)  Resp 18   Ht 5\' 11"  (1.803 m)   Wt 131.5 kg (290 lb)   SpO2 100%   BMI 40.45 kg/m   Physical Exam  Constitutional: He is oriented to person, place, and time. He appears well-developed and well-nourished.  HENT:  Head: Normocephalic and atraumatic.  Cardiovascular: Normal rate and regular rhythm.  Pulmonary/Chest: Effort normal. No respiratory distress.  Genitourinary:  Genitourinary Comments: Pilonidal region with spontaneously draining abscess on left aspect of gluteal cleft. Spontaneously draining region about 1 cm  Moderate surrounding erythema and induration, about 4-5 cm.    Musculoskeletal: He exhibits no edema or  tenderness.  Neurological: He is alert and oriented to person, place, and time.  Skin: Skin is warm and dry.  Psychiatric: He has a normal mood and affect. His behavior is normal.  Nursing note and vitals reviewed.    ED Treatments / Results  Labs (all labs ordered are listed, but only abnormal results are displayed) Labs Reviewed - No data to display  EKG  EKG Interpretation None       Radiology No results found.  Procedures .Marland Kitchen.Incision and Drainage Date/Time: 12/07/2016 2:09 PM Performed by: Tilden Fossaees, Theodoros Stjames, MD Authorized by: Tilden Fossaees, Marlicia Sroka, MD   Consent:    Consent obtained:  Verbal   Consent given by:  Patient   Risks discussed:  Bleeding, infection, incomplete drainage and pain Location:    Type:  Pilonidal cyst   Location:  Anogenital   Anogenital location:  Pilonidal Pre-procedure details:    Skin preparation:  Betadine Anesthesia (see MAR for exact dosages):    Anesthesia method:  Local infiltration   Local anesthetic:  Lidocaine 2% WITH epi Procedure type:    Complexity:  Complex Procedure details:    Incision types:  Single straight   Scalpel blade:  11   Wound management:  Probed and deloculated and irrigated with saline   Drainage:  Purulent   Drainage amount:  Moderate   Packing materials:  1/4 in iodoform gauze Post-procedure details:    Patient tolerance of procedure:  Tolerated well, no immediate complications   (including critical care time)  Medications Ordered in ED Medications  oxyCODONE-acetaminophen (PERCOCET/ROXICET) 5-325 MG per tablet 1 tablet (1 tablet Oral Given 12/07/16 1316)  lidocaine-EPINEPHrine (XYLOCAINE W/EPI) 2 %-1:200000 (PF) injection 10 mL (10 mLs Infiltration Given 12/07/16 1316)     Initial Impression / Assessment and Plan / ED Course  I have reviewed the triage vital signs and the nursing notes.  Pertinent labs & imaging results that were available during my care of the patient were reviewed by me and  considered in my medical decision making (see chart for details).     Patient here for evaluation of buttock abscess.  Examination is consistent with a pilonidal abscess with local skin reaction.  He has no systemic symptoms or evidence of systemic toxicity.  Wound I and D per note.  Discussed with patient home wound care as well as outpatient follow-up and return precautions.  Final Clinical Impressions(s) / ED Diagnoses   Final diagnoses:  Pilonidal abscess    ED Discharge Orders        Ordered    amoxicillin-clavulanate (AUGMENTIN) 875-125 MG tablet  Every 12 hours     12/07/16 1408    oxyCODONE-acetaminophen (PERCOCET/ROXICET) 5-325 MG tablet  Every 6 hours PRN     12/07/16 1408    ibuprofen (ADVIL,MOTRIN) 800 MG tablet  Every 8 hours PRN     12/07/16 1408  Tilden Fossa, MD 12/07/16 1411    Tilden Fossa, MD 12/07/16 1414

## 2016-12-20 ENCOUNTER — Emergency Department (HOSPITAL_COMMUNITY)
Admission: EM | Admit: 2016-12-20 | Discharge: 2016-12-20 | Disposition: A | Discharge status: Home / Self Care | Payer: Self-pay | Attending: Emergency Medicine | Admitting: Emergency Medicine

## 2016-12-20 ENCOUNTER — Other Ambulatory Visit: Payer: Self-pay

## 2016-12-20 ENCOUNTER — Encounter (HOSPITAL_COMMUNITY): Payer: Self-pay | Admitting: Emergency Medicine

## 2016-12-20 DIAGNOSIS — R0981 Nasal congestion: Secondary | ICD-10-CM | POA: Insufficient documentation

## 2016-12-20 DIAGNOSIS — Z79899 Other long term (current) drug therapy: Secondary | ICD-10-CM | POA: Insufficient documentation

## 2016-12-20 DIAGNOSIS — J3489 Other specified disorders of nose and nasal sinuses: Secondary | ICD-10-CM | POA: Insufficient documentation

## 2016-12-20 DIAGNOSIS — J069 Acute upper respiratory infection, unspecified: Secondary | ICD-10-CM | POA: Insufficient documentation

## 2016-12-20 DIAGNOSIS — F17200 Nicotine dependence, unspecified, uncomplicated: Secondary | ICD-10-CM | POA: Insufficient documentation

## 2016-12-20 MED ORDER — AZITHROMYCIN 250 MG PO TABS: ORAL_TABLET | ORAL | 0 refills | Status: DC

## 2016-12-20 NOTE — ED Provider Notes (Signed)
[facility]   CSN: [id number] Arrival date & time: 12/20/16  [id number]     History   Chief Complaint Chief Complaint  Patient presents with  . Cough  . URI    HPI Michael House is a 46 y.o. male.  46 year old male presents with complaint of upper respiratory congestion and cough.  He reports symptoms started 2 days prior.  He denies associated fever.  He denies shortness of breath, chest pain, back pain, or other complaint.   The history is provided by the patient.  Cough  This is a new problem. The current episode started 2 days ago. The problem occurs constantly. The problem has not changed since onset.The cough is non-productive. There has been no fever. The fever has been present for 1 to 2 days. Associated symptoms include rhinorrhea. Pertinent negatives include no chest pain, no chills, no sweats and no shortness of breath. He has tried decongestants for the symptoms. The treatment provided mild relief.  URI   Associated symptoms include rhinorrhea and cough. Pertinent negatives include no chest pain.    History reviewed. No pertinent past medical history.  There are no active problems to display for this patient.   History reviewed. No pertinent surgical history.     Home Medications    Prior to Admission medications   Medication Sig Start Date End Date Taking? Authorizing Provider  ALPRAZolam Prudy Feeler(XANAX) 0.5 MG tablet Take 0.5 mg by mouth daily as needed for anxiety.    [provider]  amoxicillin-clavulanate (AUGMENTIN) 875-125 MG tablet Take 1 tablet every 12 (twelve) hours by mouth. 12/07/16   Tilden Fossaees, Elizabeth, MD  azithromycin (ZITHROMAX) 250 MG tablet Take as prescribed (2 tabs PO on day 1, then 1 tab PO Daily for total of 5 days). 12/20/16   Wynetta FinesMessick, Peter C, MD  cyclobenzaprine (FLEXERIL) 10 MG tablet Take 1 tablet (10 mg total) by mouth 2 (two) times daily as needed for muscle spasms. 11/20/16    McDonald, Mia A, PA-C  diazepam (VALIUM) 5 MG tablet Take 1 tablet (5 mg total) by mouth every 6 (six) hours as needed for muscle spasms. 03/27/13   Benjiman CorePickering, Nathan, MD  gabapentin (NEURONTIN) 300 MG capsule Take 1 capsule (300 mg total) by mouth 3 (three) times daily. 11/20/16 11/25/16  McDonald, Mia A, PA-C  guaiFENesin (MUCINEX) 600 MG 12 hr tablet Take 600 mg by mouth daily as needed. For cold    [provider]  guaiFENesin (ROBITUSSIN) 100 MG/5ML liquid Take 200 mg by mouth daily as needed for cough or congestion.    [provider]  ibuprofen (ADVIL,MOTRIN) 800 MG tablet Take 1 tablet (800 mg total) every 8 (eight) hours as needed by mouth for moderate pain. 12/07/16   Tilden Fossaees, Elizabeth, MD  Nutritional Supplements (COLD AND FLU) THERAPY PACK MISC Take 1 each by mouth daily as needed. Patient states he takes 1 packet as needed for cold.    [provider]  oxyCODONE-acetaminophen (PERCOCET/ROXICET) 5-325 MG tablet Take 2 tablets every 6 (six) hours as needed by mouth for severe pain. 12/07/16   Tilden Fossaees, Elizabeth, MD  promethazine (PHENERGAN) 25 MG tablet Take 1 tablet (25 mg total) by mouth every 6 (six) hours as needed for nausea or vomiting. 03/27/13   Benjiman CorePickering, Nathan, MD    Family History No family history on file.  Social History Social History   Tobacco Use  . Smoking status: Current Every Day Smoker  . Smokeless tobacco:  Never Used  Substance Use Topics  . Alcohol use: No  . Drug use: No     Allergies   Patient has no known allergies.   Review of Systems Review of Systems  Constitutional: Negative for chills.  HENT: Positive for rhinorrhea.   Respiratory: Positive for cough. Negative for shortness of breath.   Cardiovascular: Negative for chest pain.  All other systems reviewed and are negative.    Physical Exam Updated Vital Signs BP (!) 153/100 (BP Location: Right Arm)   Pulse 92   Temp 98.2 F (36.8 C) (Oral)   Resp (!) 22   Ht 5'  11" (1.803 m)   Wt 136.1 kg (300 lb)   SpO2 98%   BMI 41.84 kg/m   Physical Exam  Constitutional: He is oriented to person, place, and time. He appears well-developed and well-nourished.  HENT:  Head: Normocephalic and atraumatic.  Mouth/Throat: Oropharynx is clear and moist.  Eyes: Conjunctivae and EOM are normal. Pupils are equal, round, and reactive to light.  Neck: Normal range of motion. Neck supple.  Cardiovascular: Normal rate, regular rhythm and normal heart sounds.  No murmur heard. Pulmonary/Chest: Effort normal and breath sounds normal. No respiratory distress.  Abdominal: Soft. He exhibits no distension. There is no tenderness.  Musculoskeletal: Normal range of motion. He exhibits no edema or deformity.  Neurological: He is alert and oriented to person, place, and time.  Skin: Skin is warm and dry.  Psychiatric: He has a normal mood and affect.  Nursing note and vitals reviewed.    ED Treatments / Results  Labs (all labs ordered are listed, but only abnormal results are displayed) Labs Reviewed - No data to display  EKG  EKG Interpretation None       Radiology No results found.  Procedures Procedures (including critical care time)  Medications Ordered in ED Medications - No data to display   Initial Impression / Assessment and Plan / ED Course  I have reviewed the triage vital signs and the nursing notes.  Pertinent labs & imaging results that were available during my care of the patient were reviewed by me and considered in my medical decision making (see chart for details).     MSE complete  Presentation today is entirely consistent with viral upper respiratory infection.  Patient is aware of need for symptomatic treatment at home.  Patient understands the need for close follow-up.  Strict return precautions were given and understood by the patient. Since patient lacks a Primary, I will provide a script for a Z-pack for use if symptoms fail to  improve within the next 3-4 days.   Final Clinical Impressions(s) / ED Diagnoses   Final diagnoses:  Acute upper respiratory infection    ED Discharge Orders        Ordered    azithromycin (ZITHROMAX) 250 MG tablet     12/20/16 0758       Wynetta FinesMessick, Peter C, MD 12/20/16 [phone]

## 2016-12-20 NOTE — ED Triage Notes (Signed)
Reports upper respiratory symptoms with cough for a couple of days.  Denies having any pain.

## 2017-01-25 ENCOUNTER — Other Ambulatory Visit: Payer: Self-pay

## 2017-01-25 ENCOUNTER — Encounter (HOSPITAL_COMMUNITY): Payer: Self-pay | Admitting: *Deleted

## 2017-01-25 ENCOUNTER — Emergency Department (HOSPITAL_COMMUNITY): Payer: Self-pay

## 2017-01-25 ENCOUNTER — Emergency Department (HOSPITAL_COMMUNITY)
Admission: EM | Admit: 2017-01-25 | Discharge: 2017-01-25 | Disposition: A | Discharge status: Home / Self Care | Payer: Self-pay | Attending: Emergency Medicine | Admitting: Emergency Medicine

## 2017-01-25 DIAGNOSIS — Y929 Unspecified place or not applicable: Secondary | ICD-10-CM | POA: Insufficient documentation

## 2017-01-25 DIAGNOSIS — X509XXA Other and unspecified overexertion or strenuous movements or postures, initial encounter: Secondary | ICD-10-CM | POA: Insufficient documentation

## 2017-01-25 DIAGNOSIS — M5431 Sciatica, right side: Secondary | ICD-10-CM | POA: Insufficient documentation

## 2017-01-25 DIAGNOSIS — S93401A Sprain of unspecified ligament of right ankle, initial encounter: Secondary | ICD-10-CM | POA: Insufficient documentation

## 2017-01-25 DIAGNOSIS — S93601A Unspecified sprain of right foot, initial encounter: Secondary | ICD-10-CM | POA: Insufficient documentation

## 2017-01-25 DIAGNOSIS — Y999 Unspecified external cause status: Secondary | ICD-10-CM | POA: Insufficient documentation

## 2017-01-25 DIAGNOSIS — Y939 Activity, unspecified: Secondary | ICD-10-CM | POA: Insufficient documentation

## 2017-01-25 DIAGNOSIS — F17210 Nicotine dependence, cigarettes, uncomplicated: Secondary | ICD-10-CM | POA: Insufficient documentation

## 2017-01-25 MED ORDER — KETOROLAC TROMETHAMINE 30 MG/ML IJ SOLN
30.0000 mg | Freq: Once | INTRAMUSCULAR | Status: AC
  Administered 2017-01-25: 30 mg via INTRAMUSCULAR
  Filled 2017-01-25: qty 1

## 2017-01-25 MED ORDER — IBUPROFEN 600 MG PO TABS: 600.0000 mg | ORAL_TABLET | Freq: Three times a day (TID) | ORAL | 0 refills | Status: DC | PRN

## 2017-01-25 MED ORDER — METHOCARBAMOL 500 MG PO TABS: 500.0000 mg | ORAL_TABLET | Freq: Three times a day (TID) | ORAL | 0 refills | Status: DC | PRN

## 2017-01-25 MED ORDER — GABAPENTIN 300 MG PO CAPS: 300.0000 mg | ORAL_CAPSULE | Freq: Three times a day (TID) | ORAL | 0 refills | Status: DC

## 2017-01-25 MED ORDER — OXYCODONE-ACETAMINOPHEN 5-325 MG PO TABS
1.0000 | ORAL_TABLET | Freq: Once | ORAL | Status: AC
  Administered 2017-01-25: 1 via ORAL
  Filled 2017-01-25: qty 1

## 2017-01-25 NOTE — ED Provider Notes (Signed)
[facility] EMERGENCY DEPARTMENT Provider Note   CSN: [id number] Arrival date & time: 01/25/17  1031     History   Chief Complaint Chief Complaint  Patient presents with  . Ankle Pain  . Fall    HPI Michael House is a 47 y.o. male.  HPI Patient presents with pain in his right foot ankle knee and back.  States he fell 2 days ago when he stepped in hole.  States his ankle was bent sideways and his leg was straight.  Complaining of moderate pain since.  Difficulty walking.  Has pain in the back that radiates down the leg.  States he has had sciatica before but it was on the other side.  No loss of bladder or bowel control.  No fevers or chills.  No relief with ibuprofen. History reviewed. No pertinent past medical history.  There are no active problems to display for this patient.   History reviewed. No pertinent surgical history.     Home Medications    Prior to Admission medications   Medication Sig Start Date End Date Taking? Authorizing Provider  ALPRAZolam Prudy Feeler) 0.5 MG tablet Take 0.5 mg by mouth daily as needed for anxiety.    [provider]  amoxicillin-clavulanate (AUGMENTIN) 875-125 MG tablet Take 1 tablet every 12 (twelve) hours by mouth. 12/07/16   Tilden Fossa, MD  azithromycin (ZITHROMAX) 250 MG tablet Take as prescribed (2 tabs PO on day 1, then 1 tab PO Daily for total of 5 days). 12/20/16   Wynetta Fines, MD  cyclobenzaprine (FLEXERIL) 10 MG tablet Take 1 tablet (10 mg total) by mouth 2 (two) times daily as needed for muscle spasms. 11/20/16   McDonald, Mia A, PA-C  diazepam (VALIUM) 5 MG tablet Take 1 tablet (5 mg total) by mouth every 6 (six) hours as needed for muscle spasms. 03/27/13   Benjiman Core, MD  gabapentin (NEURONTIN) 300 MG capsule Take 1 capsule (300 mg total) by mouth 3 (three) times daily for 5 days. 01/25/17 01/30/17  Benjiman Core, MD  guaiFENesin (MUCINEX) 600 MG 12 hr tablet Take 600 mg by mouth daily as  needed. For cold    [provider]  guaiFENesin (ROBITUSSIN) 100 MG/5ML liquid Take 200 mg by mouth daily as needed for cough or congestion.    [provider]  ibuprofen (ADVIL,MOTRIN) 600 MG tablet Take 1 tablet (600 mg total) by mouth every 8 (eight) hours as needed. 01/25/17   Benjiman Core, MD  methocarbamol (ROBAXIN) 500 MG tablet Take 1 tablet (500 mg total) by mouth every 8 (eight) hours as needed for muscle spasms. 01/25/17   Benjiman Core, MD  Nutritional Supplements (COLD AND FLU) THERAPY PACK MISC Take 1 each by mouth daily as needed. Patient states he takes 1 packet as needed for cold.    [provider]  oxyCODONE-acetaminophen (PERCOCET/ROXICET) 5-325 MG tablet Take 2 tablets every 6 (six) hours as needed by mouth for severe pain. 12/07/16   Tilden Fossa, MD  promethazine (PHENERGAN) 25 MG tablet Take 1 tablet (25 mg total) by mouth every 6 (six) hours as needed for nausea or vomiting. 03/27/13   Benjiman Core, MD    Family History No family history on file.  Social History Social History   Tobacco Use  . Smoking status: Current Every Day Smoker    Packs/day: 0.50    Types: Cigarettes  . Smokeless tobacco: Never Used  Substance Use Topics  . Alcohol use: No  .  Drug use: No     Allergies   Patient has no known allergies.   Review of Systems Review of Systems  Constitutional: Negative for appetite change and fever.  HENT: Negative for congestion.   Cardiovascular: Negative for chest pain.  Gastrointestinal: Negative for abdominal pain.  Genitourinary: Negative for difficulty urinating and flank pain.  Musculoskeletal: Positive for back pain.       Right foot ankle and knee pain.  Neurological: Negative for weakness and numbness.     Physical Exam Updated Vital Signs BP (!) 149/100 (BP Location: Right Arm)   Pulse 87   Temp 98.2 F (36.8 C) (Oral)   Resp 14   SpO2 100%   Physical Exam  Constitutional: He appears  well-developed.  HENT:  Head: Atraumatic.  Cardiovascular: Normal rate.  Abdominal: There is no tenderness.  Musculoskeletal: He exhibits tenderness.  Swelling over foot with tenderness.  Skin intact.  Tenderness over right ankle medially and laterally.  Skin intact.  Tendon is over proximal fibula and on posterior right knee.  No tenderness over hip with good range of motion in the hip.  Tenderness over right lower lumbar spine and SI joint area.  Skin: Skin is warm. Capillary refill takes less than 2 seconds.  Psychiatric: He has a normal mood and affect.     ED Treatments / Results  Labs (all labs ordered are listed, but only abnormal results are displayed) Labs Reviewed - No data to display  EKG  EKG Interpretation None       Radiology Dg Lumbar Spine Complete  Result Date: 01/25/2017 CLINICAL DATA:  Low back pain since the patient suffered a fall today when he stepped in a hole. Initial encounter. EXAM: LUMBAR SPINE - COMPLETE 4+ VIEW COMPARISON:  Plain films lumbar spine 03/27/2013. FINDINGS: There is no evidence of lumbar spine fracture. Alignment is normal. Intervertebral disc spaces are maintained. Aortic atherosclerosis is noted. IMPRESSION: Normal appearing lumbar spine. Atherosclerosis. Electronically Signed   By: Drusilla Kanner M.D.   On: 01/25/2017 13:57   Dg Ankle Complete Right  Result Date: 01/25/2017 CLINICAL DATA:  Twisting injury right ankle when the patient stepped in a hole and fell today. Initial encounter. EXAM: RIGHT ANKLE - COMPLETE 3+ VIEW COMPARISON:  None. FINDINGS: No acute bony or joint abnormality is identified. Mild tibiotalar degenerative change and a small plantar calcaneal spur are seen. Soft tissues are unremarkable. IMPRESSION: No acute abnormality. Electronically Signed   By: Drusilla Kanner M.D.   On: 01/25/2017 13:55   Dg Knee Complete 4 Views Right  Result Date: 01/25/2017 CLINICAL DATA:  Right knee pain since the patient suffered a  twisting injury in a fall when he stepped in a hole today. Initial encounter. EXAM: RIGHT KNEE - COMPLETE 4+ VIEW COMPARISON:  None. FINDINGS: No evidence of fracture, dislocation, or joint effusion. No evidence of arthropathy or other focal bone abnormality. Soft tissues are unremarkable. IMPRESSION: Negative exam. Electronically Signed   By: Drusilla Kanner M.D.   On: 01/25/2017 13:56   Dg Foot Complete Right  Result Date: 01/25/2017 CLINICAL DATA:  Twisting injury right foot and ankle and a fall when the patient stepped in a hole today. Initial encounter. EXAM: RIGHT FOOT COMPLETE - 3+ VIEW COMPARISON:  None. FINDINGS: No acute bony or joint abnormality is seen. Mild midfoot degenerative change noted. Small plantar calcaneal spur is identified. A 0.2 cm linear radiopaque foreign body is seen in the subcutaneous tissues off the tuft of the great  toe IMPRESSION: No acute bony abnormality. 0.2 cm radiopaque foreign body in the shallow subcutaneous tissues of the great toe is age indeterminate. Electronically Signed   By: Drusilla Kannerhomas  Dalessio M.D.   On: 01/25/2017 13:54    Procedures Procedures (including critical care time)  Medications Ordered in ED Medications  oxyCODONE-acetaminophen (PERCOCET/ROXICET) 5-325 MG per tablet 1 tablet (1 tablet Oral Given 01/25/17 1223)  ketorolac (TORADOL) 30 MG/ML injection 30 mg (30 mg Intramuscular Given 01/25/17 1222)     Initial Impression / Assessment and Plan / ED Course  I have reviewed the triage vital signs and the nursing notes.  Pertinent labs & imaging results that were available during my care of the patient were reviewed by me and considered in my medical decision making (see chart for details).     Patient with back and extremity pain for fall.  X-rays reassuring.  Discussed possible cam walker with patient but he would rather have just an Ace bandage.  He may pick up crutches at medical supply store.  Orth O follow-up as needed.  Will refill some of  his old medicines.  Final Clinical Impressions(s) / ED Diagnoses   Final diagnoses:  Sprain of right ankle, unspecified ligament, initial encounter  Sprain of right foot, initial encounter  Sciatica of right side    ED Discharge Orders        Ordered    ibuprofen (ADVIL,MOTRIN) 600 MG tablet  Every 8 hours PRN     01/25/17 1453    methocarbamol (ROBAXIN) 500 MG tablet  Every 8 hours PRN     01/25/17 1453    gabapentin (NEURONTIN) 300 MG capsule  3 times daily     01/25/17 1453       Benjiman CorePickering, Lovella Hardie, MD 01/25/17 1455

## 2017-01-25 NOTE — ED Triage Notes (Signed)
Pt in c/o R ankle pain and swelling post fall x2 days ago, pt c/o Lower back pain and R hip pain, pt ambulatory, MAE, Denies hitting head &denies LOC, A&O x4

## 2017-01-25 NOTE — ED Notes (Signed)
EDP approved 4 days off from work

## 2017-02-13 ENCOUNTER — Encounter (HOSPITAL_COMMUNITY): Payer: Self-pay | Admitting: Emergency Medicine

## 2017-02-13 ENCOUNTER — Emergency Department (HOSPITAL_COMMUNITY)
Admission: EM | Admit: 2017-02-13 | Discharge: 2017-02-13 | Disposition: A | Discharge status: Home / Self Care | Payer: Self-pay | Attending: Emergency Medicine | Admitting: Emergency Medicine

## 2017-02-13 DIAGNOSIS — F17210 Nicotine dependence, cigarettes, uncomplicated: Secondary | ICD-10-CM | POA: Insufficient documentation

## 2017-02-13 DIAGNOSIS — K047 Periapical abscess without sinus: Secondary | ICD-10-CM | POA: Insufficient documentation

## 2017-02-13 DIAGNOSIS — Z79899 Other long term (current) drug therapy: Secondary | ICD-10-CM | POA: Insufficient documentation

## 2017-02-13 MED ORDER — AMOXICILLIN 500 MG PO CAPS
1000.0000 mg | ORAL_CAPSULE | Freq: Once | ORAL | Status: AC
  Administered 2017-02-13: 1000 mg via ORAL
  Filled 2017-02-13: qty 2

## 2017-02-13 MED ORDER — METRONIDAZOLE 500 MG PO TABS
500.0000 mg | ORAL_TABLET | Freq: Once | ORAL | Status: AC
  Administered 2017-02-13: 500 mg via ORAL
  Filled 2017-02-13: qty 1

## 2017-02-13 MED ORDER — OXYCODONE-ACETAMINOPHEN 5-325 MG PO TABS: 1.0000 | ORAL_TABLET | Freq: Four times a day (QID) | ORAL | 0 refills | Status: DC | PRN

## 2017-02-13 MED ORDER — AMOXICILLIN 500 MG PO CAPS: 1000.0000 mg | ORAL_CAPSULE | Freq: Two times a day (BID) | ORAL | 0 refills | Status: DC

## 2017-02-13 MED ORDER — METRONIDAZOLE 500 MG PO TABS: 500.0000 mg | ORAL_TABLET | Freq: Three times a day (TID) | ORAL | 0 refills | Status: AC

## 2017-02-13 MED ORDER — OXYCODONE-ACETAMINOPHEN 5-325 MG PO TABS
1.0000 | ORAL_TABLET | Freq: Once | ORAL | Status: AC
  Administered 2017-02-13: 1 via ORAL
  Filled 2017-02-13: qty 1

## 2017-02-13 NOTE — ED Provider Notes (Signed)
[facility] EMERGENCY DEPARTMENT Provider Note   CSN: [id number] Arrival date & time: 02/13/17  1229     History   Chief Complaint Chief Complaint  Patient presents with  . Dental Pain     HPI   Blood pressure (!) 136/93, pulse 75, temperature (!) 97.5 F (36.4 C), temperature source Oral, resp. rate 18, height 5\' 11"  (1.803 m), weight 127.9 kg (282 lb), SpO2 97 %.  Michael House is a 47 y.o. male complaining of  History reviewed. No pertinent past medical history.  There are no active problems to display for this patient.   History reviewed. No pertinent surgical history.     Home Medications    Prior to Admission medications   Medication Sig Start Date End Date Taking? Authorizing Provider  ALPRAZolam Prudy Feeler) 0.5 MG tablet Take 0.5 mg by mouth daily as needed for anxiety.    [provider]  amoxicillin (AMOXIL) 500 MG capsule Take 2 capsules (1,000 mg total) by mouth 2 (two) times daily. 02/13/17   Aveya Beal, Joni Reining, PA-C  amoxicillin-clavulanate (AUGMENTIN) 875-125 MG tablet Take 1 tablet every 12 (twelve) hours by mouth. 12/07/16   Tilden Fossa, MD  azithromycin (ZITHROMAX) 250 MG tablet Take as prescribed (2 tabs PO on day 1, then 1 tab PO Daily for total of 5 days). 12/20/16   Wynetta Fines, MD  cyclobenzaprine (FLEXERIL) 10 MG tablet Take 1 tablet (10 mg total) by mouth 2 (two) times daily as needed for muscle spasms. 11/20/16   McDonald, Mia A, PA-C  diazepam (VALIUM) 5 MG tablet Take 1 tablet (5 mg total) by mouth every 6 (six) hours as needed for muscle spasms. 03/27/13   Benjiman Core, MD  gabapentin (NEURONTIN) 300 MG capsule Take 1 capsule (300 mg total) by mouth 3 (three) times daily for 5 days. 01/25/17 01/30/17  Benjiman Core, MD  guaiFENesin (MUCINEX) 600 MG 12 hr tablet Take 600 mg by mouth daily as needed. For cold    [provider]  guaiFENesin (ROBITUSSIN) 100 MG/5ML liquid Take 200 mg by mouth daily as  needed for cough or congestion.    [provider]  ibuprofen (ADVIL,MOTRIN) 600 MG tablet Take 1 tablet (600 mg total) by mouth every 8 (eight) hours as needed. 01/25/17   Benjiman Core, MD  methocarbamol (ROBAXIN) 500 MG tablet Take 1 tablet (500 mg total) by mouth every 8 (eight) hours as needed for muscle spasms. 01/25/17   Benjiman Core, MD  metroNIDAZOLE (FLAGYL) 500 MG tablet Take 1 tablet (500 mg total) by mouth 3 (three) times daily for 7 days. Do not drink alcohol while taking this medication or for several days after you finish the course 02/13/17 02/20/17  Anistyn Graddy, Joni Reining, PA-C  Nutritional Supplements (COLD AND FLU) THERAPY PACK MISC Take 1 each by mouth daily as needed. Patient states he takes 1 packet as needed for cold.    [provider]  oxyCODONE-acetaminophen (PERCOCET) 5-325 MG tablet Take 1 tablet by mouth every 6 (six) hours as needed. 02/13/17   Annalissa Murphey, Joni Reining, PA-C  promethazine (PHENERGAN) 25 MG tablet Take 1 tablet (25 mg total) by mouth every 6 (six) hours as needed for nausea or vomiting. 03/27/13   Benjiman Core, MD    Family History History reviewed. No pertinent family history.  Social History Social History   Tobacco Use  . Smoking status: Current Every Day Smoker    Packs/day: 0.50    Types: Cigarettes  . Smokeless tobacco: Never  Used  Substance Use Topics  . Alcohol use: No  . Drug use: No     Allergies   Patient has no known allergies.   Review of Systems Review of Systems  A complete review of systems was obtained and all systems are negative except as noted in the HPI and PMH.   Physical Exam Updated Vital Signs BP (!) 136/93 (BP Location: Right Arm)   Pulse 75   Temp (!) 97.5 F (36.4 C) (Oral)   Resp 18   Ht 5\' 11"  (1.803 m)   Wt 127.9 kg (282 lb)   SpO2 97%   BMI 39.33 kg/m   Physical Exam  Constitutional: He is oriented to person, place, and time. He appears well-developed and well-nourished. No  distress.  HENT:  Head: Normocephalic and atraumatic.  Mouth/Throat: Oropharynx is clear and moist.    Generally poor dentition with deep dental caries. Patient has fluctuant abscess as diagrammed, swelling visible from on the face but with with no facial cellulitis. Patient is handling their secretions. There is no tenderness to palpation or firmness underneath tongue bilaterally. No trismus.      Eyes: Conjunctivae and EOM are normal. Pupils are equal, round, and reactive to light.  Neck: Normal range of motion.  Cardiovascular: Normal rate, regular rhythm and intact distal pulses.  Pulmonary/Chest: Effort normal and breath sounds normal.  Abdominal: Soft. There is no tenderness.  Musculoskeletal: Normal range of motion.  Neurological: He is alert and oriented to person, place, and time.  Skin: He is not diaphoretic.  Psychiatric: He has a normal mood and affect.  Nursing note and vitals reviewed.    ED Treatments / Results  Labs (all labs ordered are listed, but only abnormal results are displayed) Labs Reviewed - No data to display  EKG  EKG Interpretation None       Radiology No results found.  Procedures Procedures (including critical care time)  Medications Ordered in ED Medications  oxyCODONE-acetaminophen (PERCOCET/ROXICET) 5-325 MG per tablet 1 tablet (1 tablet Oral Given 02/13/17 1345)  amoxicillin (AMOXIL) capsule 1,000 mg (1,000 mg Oral Given 02/13/17 1345)  metroNIDAZOLE (FLAGYL) tablet 500 mg (500 mg Oral Given 02/13/17 1345)     Initial Impression / Assessment and Plan / ED Course  I have reviewed the triage vital signs and the nursing notes.  Pertinent labs & imaging results that were available during my care of the patient were reviewed by me and considered in my medical decision making (see chart for details).     Vitals:   02/13/17 1244 02/13/17 1246  BP: (!) 136/93   Pulse: 75   Resp: 18   Temp: (!) 97.5 F (36.4 C)   TempSrc: Oral     SpO2: 97%   Weight:  127.9 kg (282 lb)  Height:  5\' 11"  (1.803 m)    Medications  oxyCODONE-acetaminophen (PERCOCET/ROXICET) 5-325 MG per tablet 1 tablet (1 tablet Oral Given 02/13/17 1345)  amoxicillin (AMOXIL) capsule 1,000 mg (1,000 mg Oral Given 02/13/17 1345)  metroNIDAZOLE (FLAGYL) tablet 500 mg (500 mg Oral Given 02/13/17 1345)    Michael House is 47 y.o. male presenting with dental abscess, offered I&D but patient declined.  Patient given Flagyl, amoxicillin and oxycodone for pain control, dental resource guide given.  No signs of systemic infection.  Evaluation does not show pathology that would require ongoing emergent intervention or inpatient treatment. Pt is hemodynamically stable and mentating appropriately. Discussed findings and plan with patient/guardian, who agrees with  care plan. All questions answered. Return precautions discussed and outpatient follow up given.      Final Clinical Impressions(s) / ED Diagnoses   Final diagnoses:  Dental abscess    ED Discharge Orders        Ordered    oxyCODONE-acetaminophen (PERCOCET) 5-325 MG tablet  Every 6 hours PRN     02/13/17 1337    metroNIDAZOLE (FLAGYL) 500 MG tablet  3 times daily     02/13/17 1337    amoxicillin (AMOXIL) 500 MG capsule  2 times daily     02/13/17 1337       Dejai Schubach, Mardella Laymanicole, PA-C 02/13/17 1446    Benjiman CorePickering, Nathan, MD 02/13/17 1531

## 2017-02-13 NOTE — ED Triage Notes (Signed)
Pt presents to ED for assessment of right upper dental pain, swollen gums and face, states no dental provider at this time.  Pain started yesterday.

## 2017-02-13 NOTE — ED Notes (Signed)
Called patient to move to room. Unable to locate patient at this time. 

## 2017-02-13 NOTE — Discharge Instructions (Signed)
Apply warm compresses to the outside of  jaw throughout the day.   Take percocet for breakthrough pain, do not drink alcohol, drive, care for children or do other critical tasks while taking percocet.  Take your antibiotics as directed and to the end of the course. DO NOT drink alcohol when taking metronidazole, it will make you very sick!    Followup with a dentist is very important for dental pain. Return to emergency department for emergent changing or worsening symptoms including  fever, change in vision, redness to the face that rapidly spreads towards the eye, nausea or vomiting, difficulty swallowing or shortness of breath.

## 2017-02-15 ENCOUNTER — Emergency Department (HOSPITAL_COMMUNITY)
Admission: EM | Admit: 2017-02-15 | Discharge: 2017-02-15 | Disposition: A | Discharge status: Home / Self Care | Payer: Self-pay | Attending: Emergency Medicine | Admitting: Emergency Medicine

## 2017-02-15 ENCOUNTER — Encounter (HOSPITAL_COMMUNITY): Payer: Self-pay

## 2017-02-15 ENCOUNTER — Other Ambulatory Visit: Payer: Self-pay

## 2017-02-15 DIAGNOSIS — Z79899 Other long term (current) drug therapy: Secondary | ICD-10-CM | POA: Insufficient documentation

## 2017-02-15 DIAGNOSIS — F17210 Nicotine dependence, cigarettes, uncomplicated: Secondary | ICD-10-CM | POA: Insufficient documentation

## 2017-02-15 DIAGNOSIS — K0889 Other specified disorders of teeth and supporting structures: Secondary | ICD-10-CM

## 2017-02-15 DIAGNOSIS — K089 Disorder of teeth and supporting structures, unspecified: Secondary | ICD-10-CM | POA: Insufficient documentation

## 2017-02-15 MED ORDER — AMOXICILLIN 500 MG PO CAPS
500.0000 mg | ORAL_CAPSULE | Freq: Once | ORAL | Status: AC
Start: 2017-02-15 — End: 2017-02-15
  Administered 2017-02-15: 500 mg via ORAL
  Filled 2017-02-15: qty 1

## 2017-02-15 MED ORDER — METRONIDAZOLE 500 MG PO TABS
500.0000 mg | ORAL_TABLET | Freq: Once | ORAL | Status: AC
Start: 2017-02-15 — End: 2017-02-15
  Administered 2017-02-15: 500 mg via ORAL
  Filled 2017-02-15: qty 1

## 2017-02-15 NOTE — ED Triage Notes (Signed)
Patient complains of ongoing dental pain and facial swelling, unable to get prescriptions filled and needs further eval

## 2017-02-15 NOTE — ED Notes (Signed)
Pt in reporting continued dental pain and swelling has now moved into his face and right orbital cavity, reports changes in vision since last night and pressure to right eye

## 2017-02-15 NOTE — Discharge Instructions (Signed)
You were seen in the emergency department today for continued and worsening dental pain.  You were given 1 dose of amoxicillin and 1 dose of Flagyl in the emergency department, these are the antibiotic she was prescribed at your previous visit.  It is essential that you take your antibiotics that you were given prescriptions for your previous visit.  Take ibuprofen for pain and the Percocet you are prescribed for severe pain.  Do not drive or operate heavy machinery when taking Percocet.  Follow-up with 1 of the dentist and the dental resource guide provided to you within the next 48 hours.  Return to the emergency department for new or worsening symptoms including but not limited to inability to open your mouth, inability to move your neck, neck stiffness/pain, fevers, or any other concerns.

## 2017-02-15 NOTE — ED Provider Notes (Signed)
[facility] Provider Note   CSN: [id number] Arrival date & time: 02/15/17  0740     History   Chief Complaint No chief complaint on file.   HPI Michael House is a 47 y.o. male with a hx of tobacco abuse who returns to the ED with complaint of continued dental pain since ED visit 01/26. Patient states he has been experiencing R upper dental pain since 01/24. Was seen in the ED and diagnosed with a dental abscess- declined I&D- was given prescriptions for Amoxicillin, Flagyl, and Percocet- states he has been unable to fill these due to financial difficulty. Pay day is Thursday and he can get afford medicines then. Feels pain is radiating to entire R face including area just inferior to the eye. Reports additional swelling in the R face. Rates the pain a 9/10 in severity, tried tylenol and naproxen each without relief. States he developed very mild blurry vision in the R eye, started as intermittent, now constant. Denies fevers, chills, eye discharge, eye pruritus, eye redness, or pain to the eye itself.  HPI  History reviewed. No pertinent past medical history.  There are no active problems to display for this patient.   History reviewed. No pertinent surgical history.     Home Medications    Prior to Admission medications   Medication Sig Start Date End Date Taking? Authorizing Provider  ALPRAZolam Prudy Feeler(XANAX) 0.5 MG tablet Take 0.5 mg by mouth daily as needed for anxiety.    [provider]  amoxicillin (AMOXIL) 500 MG capsule Take 2 capsules (1,000 mg total) by mouth 2 (two) times daily. 02/13/17   Pisciotta, Joni ReiningNicole, PA-C  amoxicillin-clavulanate (AUGMENTIN) 875-125 MG tablet Take 1 tablet every 12 (twelve) hours by mouth. 12/07/16   Tilden Fossaees, Elizabeth, MD  azithromycin (ZITHROMAX) 250 MG tablet Take as prescribed (2 tabs PO on day 1, then 1 tab PO Daily for total of 5 days). 12/20/16   Wynetta FinesMessick, Peter C, MD  cyclobenzaprine (FLEXERIL) 10  MG tablet Take 1 tablet (10 mg total) by mouth 2 (two) times daily as needed for muscle spasms. 11/20/16   McDonald, Mia A, PA-C  diazepam (VALIUM) 5 MG tablet Take 1 tablet (5 mg total) by mouth every 6 (six) hours as needed for muscle spasms. 03/27/13   Benjiman CorePickering, Nathan, MD  gabapentin (NEURONTIN) 300 MG capsule Take 1 capsule (300 mg total) by mouth 3 (three) times daily for 5 days. 01/25/17 01/30/17  Benjiman CorePickering, Nathan, MD  guaiFENesin (MUCINEX) 600 MG 12 hr tablet Take 600 mg by mouth daily as needed. For cold    [provider]  guaiFENesin (ROBITUSSIN) 100 MG/5ML liquid Take 200 mg by mouth daily as needed for cough or congestion.    [provider]  ibuprofen (ADVIL,MOTRIN) 600 MG tablet Take 1 tablet (600 mg total) by mouth every 8 (eight) hours as needed. 01/25/17   Benjiman CorePickering, Nathan, MD  methocarbamol (ROBAXIN) 500 MG tablet Take 1 tablet (500 mg total) by mouth every 8 (eight) hours as needed for muscle spasms. 01/25/17   Benjiman CorePickering, Nathan, MD  metroNIDAZOLE (FLAGYL) 500 MG tablet Take 1 tablet (500 mg total) by mouth 3 (three) times daily for 7 days. Do not drink alcohol while taking this medication or for several days after you finish the course 02/13/17 02/20/17  Pisciotta, Joni ReiningNicole, PA-C  Nutritional Supplements (COLD AND FLU) THERAPY PACK MISC Take 1 each by mouth daily as needed. Patient states he takes 1 packet as needed for  cold.    [provider]  oxyCODONE-acetaminophen (PERCOCET) 5-325 MG tablet Take 1 tablet by mouth every 6 (six) hours as needed. 02/13/17   Pisciotta, Joni Reining, PA-C  promethazine (PHENERGAN) 25 MG tablet Take 1 tablet (25 mg total) by mouth every 6 (six) hours as needed for nausea or vomiting. 03/27/13   Benjiman Core, MD    Family History History reviewed. No pertinent family history.  Social History Social History   Tobacco Use  . Smoking status: Current Every Day Smoker    Packs/day: 0.50    Types: Cigarettes  . Smokeless tobacco: Never  Used  Substance Use Topics  . Alcohol use: No  . Drug use: No     Allergies   Patient has no known allergies.   Review of Systems Review of Systems  Constitutional: Negative for chills and fever.  HENT: Positive for dental problem and facial swelling. Negative for congestion, drooling and trouble swallowing.   Eyes: Positive for visual disturbance (mild blurry vision). Negative for photophobia, pain, discharge, redness and itching.  Respiratory: Negative for shortness of breath.   Cardiovascular: Negative for chest pain.  Musculoskeletal: Negative for neck pain and neck stiffness.    Physical Exam Updated Vital Signs BP 132/70 (BP Location: Right Arm)   Pulse 84   Temp 98.9 F (37.2 C) (Oral)   Resp 20   SpO2 100%   Physical Exam  Constitutional: He appears well-developed and well-nourished.  Non-toxic appearance. No distress.  HENT:  Head: Normocephalic and atraumatic.  Right Ear: Tympanic membrane is not perforated, not erythematous, not retracted and not bulging.  Left Ear: Tympanic membrane is not perforated, not erythematous, not retracted and not bulging.  Nose: Nose normal.  Mouth/Throat: Uvula is midline and oropharynx is clear and moist. No trismus in the jaw. No dental abscesses or uvula swelling. No oropharyngeal exudate or posterior oropharyngeal erythema.    Diffuse mild right sided facial swelling noted which extends from jaw to cheek and just inferior to the eye.   No hot potato voice. Patient is tolerating his own secretions without difficulty. Submandibular compartment is soft.   Eyes: Conjunctivae and EOM are normal. Pupils are equal, round, and reactive to light. Right eye exhibits no discharge. Left eye exhibits no discharge. Right conjunctiva is not injected. Left conjunctiva is not injected.  No proptosis.  There is mild swelling inferior to the eye otherwise no periorbital swelling.  No overlying erythema or warmth.  Neurological: He is alert.    Clear speech.   Psychiatric: He has a normal mood and affect. His behavior is normal. Thought content normal.  Nursing note and vitals reviewed.   ED Treatments / Results  Labs (all labs ordered are listed, but only abnormal results are displayed) Labs Reviewed - No data to display  EKG  EKG Interpretation None      Radiology No results found.  Procedures Procedures (including critical care time)  Medications Ordered in ED Medications  amoxicillin (AMOXIL) capsule 500 mg (500 mg Oral Given 02/15/17 1036)  metroNIDAZOLE (FLAGYL) tablet 500 mg (500 mg Oral Given 02/15/17 1036)     Initial Impression / Assessment and Plan / ED Course  I have reviewed the triage vital signs and the nursing notes.  Pertinent labs & imaging results that were available during my care of the patient were reviewed by me and considered in my medical decision making (see chart for details).   Presents with continued right upper dental pain in the setting of  not being able to afford his medications from last ED visit. Patient is nontoxic appearing, vitals are WNL.  No gross abscess.  Exam unconcerning for Ludwig's angina or spread of infection. Additionally patient with mild blurry vision, no eye pain/redness/discharge. Patient is afebrile and without proptosis, entrapment, or consensual photophobia, there is a small amount of swelling just inferior to the eye, otherwise no periorbital swelling- doubt periorbital or orbital cellulitis.  Patient given a dose of his prescribed antibiotics in the emergency department.  Case management was consulted to assist with patient being able to afford his medications.  Patient discussed with his wife on the phone, states he will be able to afford them today and does not need to see case management. Urged patient to follow-up with dentist, dental resources were provided.  Discussed importance of obtaining his medications and taking them as prescribed as well as follow-up  with a dentist.  Additionally discussed strict return precautions.  Provided opportunity for questions, patient confirmed understanding and is agreeable to plan.  Findings and plan of care discussed with supervising physician Dr. Estell Harpin who personally evaluated and examined this patient and is in agreement with plan.    Final Clinical Impressions(s) / ED Diagnoses   Final diagnoses:  Pain, dental    ED Discharge Orders    None       Cherly Anderson, PA-C 02/15/17 1729    Bethann Berkshire, MD 02/20/17 1154

## 2018-08-09 ENCOUNTER — Emergency Department (HOSPITAL_COMMUNITY): Payer: Self-pay

## 2018-08-09 ENCOUNTER — Observation Stay (HOSPITAL_COMMUNITY): Payer: Self-pay

## 2018-08-09 ENCOUNTER — Other Ambulatory Visit: Payer: Self-pay

## 2018-08-09 ENCOUNTER — Encounter (HOSPITAL_COMMUNITY): Payer: Self-pay | Admitting: Emergency Medicine

## 2018-08-09 ENCOUNTER — Inpatient Hospital Stay (HOSPITAL_COMMUNITY)
Admission: EM | Admit: 2018-08-09 | Discharge: 2018-08-10 | DRG: 084 | Discharge status: Against Medical Advice | Payer: Self-pay | Attending: Neurosurgery | Admitting: Neurosurgery

## 2018-08-09 DIAGNOSIS — Z590 Homelessness: Secondary | ICD-10-CM

## 2018-08-09 DIAGNOSIS — S065X9A Traumatic subdural hemorrhage with loss of consciousness of unspecified duration, initial encounter: Secondary | ICD-10-CM | POA: Diagnosis present

## 2018-08-09 DIAGNOSIS — F17210 Nicotine dependence, cigarettes, uncomplicated: Secondary | ICD-10-CM | POA: Diagnosis present

## 2018-08-09 DIAGNOSIS — S61217A Laceration without foreign body of left little finger without damage to nail, initial encounter: Secondary | ICD-10-CM | POA: Diagnosis present

## 2018-08-09 DIAGNOSIS — S065XAA Traumatic subdural hemorrhage with loss of consciousness status unknown, initial encounter: Secondary | ICD-10-CM | POA: Diagnosis present

## 2018-08-09 DIAGNOSIS — H9192 Unspecified hearing loss, left ear: Secondary | ICD-10-CM | POA: Diagnosis present

## 2018-08-09 DIAGNOSIS — W228XXA Striking against or struck by other objects, initial encounter: Secondary | ICD-10-CM | POA: Diagnosis present

## 2018-08-09 DIAGNOSIS — M546 Pain in thoracic spine: Secondary | ICD-10-CM | POA: Diagnosis present

## 2018-08-09 DIAGNOSIS — S0101XA Laceration without foreign body of scalp, initial encounter: Secondary | ICD-10-CM | POA: Diagnosis present

## 2018-08-09 DIAGNOSIS — Z1159 Encounter for screening for other viral diseases: Secondary | ICD-10-CM

## 2018-08-09 DIAGNOSIS — S62646A Nondisplaced fracture of proximal phalanx of right little finger, initial encounter for closed fracture: Secondary | ICD-10-CM

## 2018-08-09 DIAGNOSIS — I609 Nontraumatic subarachnoid hemorrhage, unspecified: Secondary | ICD-10-CM

## 2018-08-09 DIAGNOSIS — Z5329 Procedure and treatment not carried out because of patient's decision for other reasons: Secondary | ICD-10-CM | POA: Diagnosis not present

## 2018-08-09 DIAGNOSIS — S066X9A Traumatic subarachnoid hemorrhage with loss of consciousness of unspecified duration, initial encounter: Principal | ICD-10-CM | POA: Diagnosis present

## 2018-08-09 LAB — CBC WITH DIFFERENTIAL/PLATELET
Abs Immature Granulocytes: 0.04 10*3/uL (ref 0.00–0.07)
Basophils Absolute: 0.1 10*3/uL (ref 0.0–0.1)
Basophils Relative: 1 %
Eosinophils Absolute: 0 10*3/uL (ref 0.0–0.5)
Eosinophils Relative: 0 %
HCT: 46.9 % (ref 39.0–52.0)
Hemoglobin: 15.3 g/dL (ref 13.0–17.0)
Immature Granulocytes: 0 %
Lymphocytes Relative: 7 %
Lymphs Abs: 0.8 10*3/uL (ref 0.7–4.0)
MCH: 30.1 pg (ref 26.0–34.0)
MCHC: 32.6 g/dL (ref 30.0–36.0)
MCV: 92.1 fL (ref 80.0–100.0)
Monocytes Absolute: 0.7 10*3/uL (ref 0.1–1.0)
Monocytes Relative: 6 %
Neutro Abs: 10.6 10*3/uL — ABNORMAL HIGH (ref 1.7–7.7)
Neutrophils Relative %: 86 %
Platelets: 228 10*3/uL (ref 150–400)
RBC: 5.09 MIL/uL (ref 4.22–5.81)
RDW: 13.1 % (ref 11.5–15.5)
WBC: 12.3 10*3/uL — ABNORMAL HIGH (ref 4.0–10.5)
nRBC: 0 % (ref 0.0–0.2)

## 2018-08-09 LAB — BASIC METABOLIC PANEL
Anion gap: 10 (ref 5–15)
BUN: 7 mg/dL (ref 6–20)
CO2: 26 mmol/L (ref 22–32)
Calcium: 9.4 mg/dL (ref 8.9–10.3)
Chloride: 103 mmol/L (ref 98–111)
Creatinine, Ser: 0.89 mg/dL (ref 0.61–1.24)
GFR calc Af Amer: >60 mL/min (ref >60)
GFR calc non Af Amer: >60 mL/min (ref >60)
Glucose, Bld: 112 mg/dL — ABNORMAL HIGH (ref 70–99)
Potassium: 3.3 mmol/L — ABNORMAL LOW (ref 3.5–5.1)
Sodium: 139 mmol/L (ref 135–145)

## 2018-08-09 LAB — RAPID HIV SCREEN (HIV 1/2 AB+AG)
HIV 1/2 Antibodies: NONREACTIVE
HIV-1 P24 Antigen - HIV24: NONREACTIVE

## 2018-08-09 LAB — SARS CORONAVIRUS 2 BY RT PCR (HOSPITAL ORDER, PERFORMED IN ~~LOC~~ HOSPITAL LAB): SARS Coronavirus 2: NEGATIVE

## 2018-08-09 LAB — MRSA PCR SCREENING: MRSA by PCR: NEGATIVE

## 2018-08-09 MED ORDER — SODIUM CHLORIDE 0.9 % IV BOLUS
500.0000 mL | Freq: Once | INTRAVENOUS | Status: AC
  Administered 2018-08-09: 500 mL via INTRAVENOUS

## 2018-08-09 MED ORDER — HYDROCODONE-ACETAMINOPHEN 5-325 MG PO TABS
2.0000 | ORAL_TABLET | ORAL | Status: DC | PRN
  Administered 2018-08-09 – 2018-08-10 (×3): 2 via ORAL
  Filled 2018-08-09: qty 2

## 2018-08-09 MED ORDER — ACETAMINOPHEN 325 MG PO TABS
650.0000 mg | ORAL_TABLET | ORAL | Status: DC | PRN
  Filled 2018-08-09: qty 2

## 2018-08-09 MED ORDER — POTASSIUM CHLORIDE 2 MEQ/ML IV SOLN
INTRAVENOUS | Status: DC
  Filled 2018-08-09: qty 1000

## 2018-08-09 MED ORDER — MORPHINE SULFATE (PF) 4 MG/ML IV SOLN
4.0000 mg | Freq: Once | INTRAVENOUS | Status: AC
  Administered 2018-08-09: 09:00:00 4 mg via INTRAVENOUS
  Filled 2018-08-09: qty 1

## 2018-08-09 MED ORDER — LIDOCAINE-EPINEPHRINE (PF) 2 %-1:200000 IJ SOLN
20.0000 mL | Freq: Once | INTRAMUSCULAR | Status: AC
  Administered 2018-08-09: 20 mL
  Filled 2018-08-09: qty 20

## 2018-08-09 MED ORDER — HYDROMORPHONE HCL 1 MG/ML IJ SOLN
0.5000 mg | Freq: Once | INTRAMUSCULAR | Status: AC
  Administered 2018-08-09: 0.5 mg via INTRAVENOUS
  Filled 2018-08-09: qty 1

## 2018-08-09 MED ORDER — ACETAMINOPHEN 325 MG PO TABS: 650.0000 mg | ORAL_TABLET | ORAL | Status: DC | PRN

## 2018-08-09 MED ORDER — ONDANSETRON HCL 4 MG/2ML IJ SOLN: 4.0000 mg | Freq: Four times a day (QID) | INTRAMUSCULAR | Status: DC | PRN

## 2018-08-09 MED ORDER — HYDROCODONE-ACETAMINOPHEN 5-325 MG PO TABS: 1.0000 | ORAL_TABLET | ORAL | Status: DC | PRN

## 2018-08-09 MED ORDER — LIDOCAINE HCL (PF) 1 % IJ SOLN
10.0000 mL | Freq: Once | INTRAMUSCULAR | Status: AC
  Administered 2018-08-09: 10 mL
  Filled 2018-08-09: qty 10

## 2018-08-09 MED ORDER — ONDANSETRON HCL 4 MG PO TABS: 4.0000 mg | ORAL_TABLET | Freq: Four times a day (QID) | ORAL | Status: DC | PRN

## 2018-08-09 MED ORDER — KCL IN DEXTROSE-NACL 20-5-0.45 MEQ/L-%-% IV SOLN
INTRAVENOUS | Status: DC
  Administered 2018-08-09: 13:00:00 via INTRAVENOUS
  Filled 2018-08-09: qty 1000

## 2018-08-09 MED ORDER — SODIUM CHLORIDE 0.9 % IV SOLN
INTRAVENOUS | Status: DC
  Administered 2018-08-09 – 2018-08-10 (×2): via INTRAVENOUS
  Filled 2018-08-09 (×3): qty 1000

## 2018-08-09 MED ORDER — HYDROCODONE-ACETAMINOPHEN 5-325 MG PO TABS
2.0000 | ORAL_TABLET | ORAL | Status: DC | PRN
  Administered 2018-08-09 (×2): 2 via ORAL
  Filled 2018-08-09 (×4): qty 2
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
## 2018-08-09 NOTE — ED Notes (Signed)
Patient transported to X-ray 

## 2018-08-09 NOTE — ED Notes (Addendum)
Spoke to pt wife Chong Sicilian [phone]), updated provide
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
## 2018-08-09 NOTE — ED Notes (Signed)
Attempted to reach wife for update, did not answer phone
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
## 2018-08-09 NOTE — Discharge Instructions (Signed)
Please return to ED in 7-10 for suture and staple removal. Wear your finger splint for 6-8 weeks and please follow up to the hand surgeon if you are able. Please return to the emergency department sooner if you develop any new or worsening symptoms including increasing pain, redness, swelling, drainage from your wounds, or fever.
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
## 2018-08-10 ENCOUNTER — Inpatient Hospital Stay (HOSPITAL_COMMUNITY): Payer: Self-pay

## 2018-08-10 LAB — HEPATITIS C ANTIBODY (REFLEX): HCV Ab: >11 s/co ratio — ABNORMAL HIGH (ref 0.0–0.9)

## 2018-08-10 LAB — COMMENT2 - HEP PANEL

## 2018-08-10 LAB — HEPATITIS B SURFACE ANTIGEN: Hepatitis B Surface Ag: NEGATIVE

## 2018-08-10 NOTE — Progress Notes (Signed)
Patient back from CT scan.
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
## 2018-08-10 NOTE — Progress Notes (Signed)
Transported to CT 
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
## 2018-08-10 NOTE — Plan of Care (Signed)
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
## 2018-08-10 NOTE — Plan of Care (Signed)
  Problem: Education: Goal: Knowledge of General Education information will improve Description: Including pain rating scale, medication(s)/side effects and non-pharmacologic comfort measures 08/10/2018 1311 by Lawanda Cousins, RN Outcome: Completed/Met 08/10/2018 0844 by Lawanda Cousins, RN Outcome: Progressing   Problem: Health Behavior/Discharge Planning: Goal: Ability to manage health-related needs will improve 08/10/2018 1311 by Lawanda Cousins, RN Outcome: Completed/Met 08/10/2018 0844 by Lawanda Cousins, RN Outcome: Progressing   Problem: Clinical Measurements: Goal: Ability to maintain clinical measurements within normal limits will improve 08/10/2018 1311 by Lawanda Cousins, RN Outcome: Completed/Met 08/10/2018 0844 by Lawanda Cousins, RN Outcome: Progressing Goal: Will remain free from infection 08/10/2018 1311 by Lawanda Cousins, RN Outcome: Completed/Met 08/10/2018 0844 by Lawanda Cousins, RN Outcome: Progressing Goal: Diagnostic test results will improve 08/10/2018 1311 by Lawanda Cousins, RN Outcome: Completed/Met 08/10/2018 0844 by Lawanda Cousins, RN Outcome: Progressing Goal: Respiratory complications will improve 08/10/2018 1311 by Lawanda Cousins, RN Outcome: Completed/Met 08/10/2018 0844 by Lawanda Cousins, RN Outcome: Progressing Goal: Cardiovascular complication will be avoided 08/10/2018 1311 by Lawanda Cousins, RN Outcome: Completed/Met 08/10/2018 0844 by Lawanda Cousins, RN Outcome: Progressing   Problem: Activity: Goal: Risk for activity intolerance will decrease 08/10/2018 1311 by Lawanda Cousins, RN Outcome: Completed/Met 08/10/2018 0844 by Lawanda Cousins, RN Outcome: Progressing   Problem: Nutrition: Goal: Adequate nutrition will be maintained 08/10/2018 1311 by Lawanda Cousins, RN Outcome: Completed/Met 08/10/2018 0844 by Lawanda Cousins, RN Outcome: Progressing   Problem: Coping: Goal: Level of anxiety will decrease 08/10/2018 1311 by Lawanda Cousins, RN Outcome:  Completed/Met 08/10/2018 0844 by Lawanda Cousins, RN Outcome: Progressing   Problem: Elimination: Goal: Will not experience complications related to bowel motility 08/10/2018 1311 by Lawanda Cousins, RN Outcome: Completed/Met 08/10/2018 0844 by Lawanda Cousins, RN Outcome: Progressing Goal: Will not experience complications related to urinary retention 08/10/2018 1311 by Lawanda Cousins, RN Outcome: Completed/Met 08/10/2018 0844 by Lawanda Cousins, RN Outcome: Progressing   Problem: Pain Managment: Goal: General experience of comfort will improve 08/10/2018 1311 by Lawanda Cousins, RN Outcome: Completed/Met 08/10/2018 0844 by Lawanda Cousins, RN Outcome: Progressing   Problem: Safety: Goal: Ability to remain free from injury will improve 08/10/2018 1311 by Lawanda Cousins, RN Outcome: Completed/Met 08/10/2018 0844 by Lawanda Cousins, RN Outcome: Progressing   Problem: Skin Integrity: Goal: Risk for impaired skin integrity will decrease 08/10/2018 1311 by Lawanda Cousins, RN Outcome: Completed/Met 08/10/2018 0844 by Lawanda Cousins, RN Outcome: Progressing
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
## 2022-08-10 ENCOUNTER — Emergency Department (HOSPITAL_COMMUNITY): Payer: Self-pay

## 2022-08-10 ENCOUNTER — Emergency Department (HOSPITAL_COMMUNITY)
Admission: EM | Admit: 2022-08-10 | Discharge: 2022-08-10 | Discharge status: Against Medical Advice | Payer: Self-pay | Attending: Emergency Medicine | Admitting: Emergency Medicine

## 2022-08-10 ENCOUNTER — Encounter (HOSPITAL_COMMUNITY): Payer: Self-pay

## 2022-08-10 ENCOUNTER — Other Ambulatory Visit: Payer: Self-pay

## 2022-08-10 DIAGNOSIS — Y92410 Unspecified street and highway as the place of occurrence of the external cause: Secondary | ICD-10-CM | POA: Insufficient documentation

## 2022-08-10 DIAGNOSIS — L03116 Cellulitis of left lower limb: Secondary | ICD-10-CM | POA: Diagnosis not present

## 2022-08-10 DIAGNOSIS — S80212A Abrasion, left knee, initial encounter: Secondary | ICD-10-CM | POA: Insufficient documentation

## 2022-08-10 DIAGNOSIS — R2242 Localized swelling, mass and lump, left lower limb: Secondary | ICD-10-CM | POA: Diagnosis present

## 2022-08-10 MED ORDER — DOXYCYCLINE HYCLATE 100 MG PO CAPS: 100.0000 mg | ORAL_CAPSULE | Freq: Two times a day (BID) | ORAL | 0 refills | Status: AC

## 2022-08-10 MED ORDER — BACITRACIN ZINC 500 UNIT/GM EX OINT: TOPICAL_OINTMENT | Freq: Two times a day (BID) | CUTANEOUS | Status: DC
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
## 2022-08-10 NOTE — Discharge Instructions (Addendum)
Your x-rays today showed no signs of fracture or dislocation.  Please keep your knee wound clean.  You may gently wash it with soap and water.  Cover it with antibiotic ointment and clean gauze.  You have been prescribed an antibiotic, doxycycline, for your knee wound.  Take this antibiotic 2 times a day for the next 7 days. Take the full course of your antibiotic even if you start feeling better. Antibiotics may cause you to have diarrhea.  This antibiotic may also cause you to be more sensitive to sunlight.  Please avoid direct sunlight and wear sunscreen when in the sun.  Return to the ER should you develop fever, chills, pus drainage from your wound, worsening redness around your wound, worsening pain or swelling of your leg, you develop numbness or a cold leg, any other new or concerning symptoms.
